# Patient Record
Sex: Female | Born: 1995 | Race: Black or African American | Hispanic: No | Marital: Single | Smoking: Never smoker
Health system: Southern US, Community
[De-identification: ages and names within clinical notes are randomized; demographics above are authoritative.]

## PROBLEM LIST (undated history)

## (undated) DIAGNOSIS — D259 Leiomyoma of uterus, unspecified: Secondary | ICD-10-CM

## (undated) HISTORY — PX: NO PAST SURGERIES: SHX2092

---

## 2015-12-18 ENCOUNTER — Ambulatory Visit (INDEPENDENT_AMBULATORY_CARE_PROVIDER_SITE_OTHER): Payer: Medicaid Other | Admitting: Obstetrics & Gynecology

## 2015-12-18 ENCOUNTER — Encounter: Payer: Self-pay | Admitting: Obstetrics & Gynecology

## 2015-12-18 ENCOUNTER — Other Ambulatory Visit (HOSPITAL_COMMUNITY): Payer: Self-pay | Admitting: Obstetrics & Gynecology

## 2015-12-18 ENCOUNTER — Other Ambulatory Visit (HOSPITAL_COMMUNITY)
Admission: RE | Admit: 2015-12-18 | Discharge: 2015-12-18 | Disposition: A | Discharge status: Home / Self Care | Payer: Medicaid Other | Source: Ambulatory Visit | Attending: Obstetrics & Gynecology | Admitting: Obstetrics & Gynecology

## 2015-12-18 VITALS — BP 123/84 | HR 123 | Ht 63.0 in | Wt 193.0 lb

## 2015-12-18 DIAGNOSIS — O0930 Supervision of pregnancy with insufficient antenatal care, unspecified trimester: Secondary | ICD-10-CM | POA: Insufficient documentation

## 2015-12-18 DIAGNOSIS — Z124 Encounter for screening for malignant neoplasm of cervix: Secondary | ICD-10-CM

## 2015-12-18 DIAGNOSIS — Z113 Encounter for screening for infections with a predominantly sexual mode of transmission: Secondary | ICD-10-CM | POA: Insufficient documentation

## 2015-12-18 DIAGNOSIS — Z01419 Encounter for gynecological examination (general) (routine) without abnormal findings: Secondary | ICD-10-CM | POA: Diagnosis not present

## 2015-12-18 DIAGNOSIS — O0932 Supervision of pregnancy with insufficient antenatal care, second trimester: Secondary | ICD-10-CM

## 2015-12-18 DIAGNOSIS — Z3402 Encounter for supervision of normal first pregnancy, second trimester: Secondary | ICD-10-CM

## 2015-12-18 DIAGNOSIS — Z3400 Encounter for supervision of normal first pregnancy, unspecified trimester: Secondary | ICD-10-CM | POA: Insufficient documentation

## 2015-12-18 DIAGNOSIS — E669 Obesity, unspecified: Secondary | ICD-10-CM | POA: Diagnosis not present

## 2015-12-18 DIAGNOSIS — O99212 Obesity complicating pregnancy, second trimester: Secondary | ICD-10-CM

## 2015-12-18 DIAGNOSIS — Z3689 Encounter for other specified antenatal screening: Secondary | ICD-10-CM

## 2015-12-18 DIAGNOSIS — Z3401 Encounter for supervision of normal first pregnancy, first trimester: Secondary | ICD-10-CM | POA: Insufficient documentation

## 2015-12-18 DIAGNOSIS — O99210 Obesity complicating pregnancy, unspecified trimester: Secondary | ICD-10-CM

## 2015-12-18 NOTE — Progress Notes (Signed)
  Subjective:    Sue Miller is a 20 yo S AA G1P0 [redacted]w[redacted]d being seen today for her first obstetrical visit.  Her obstetrical history is significant for late prenatal care, obesity in pregnancy. Patient does intend to breast feed. Pregnancy history fully reviewed.  Patient reports no complaints.  Vitals:   12/18/15 0841 12/18/15 0842  BP: 123/84   Pulse: (!) 123   Weight: 193 lb (87.5 kg)   Height:  5\' 3"  (1.6 m)    HISTORY: OB History  Gravida Para Term Preterm AB Living  1            SAB TAB Ectopic Multiple Live Births               # Outcome Date GA Lbr Len/2nd Weight Sex Delivery Anes PTL Lv  1 Current              History reviewed. No pertinent past medical history. Past Surgical History:  Procedure Laterality Date  . NO PAST SURGERIES     Family History  Problem Relation Age of Onset  . Adopted: Yes  . Cancer Neg Hx   . Hypertension Neg Hx      Exam    Uterus:     Pelvic Exam:    Perineum: No Hemorrhoids   Vulva: normal   Vagina:  normal mucosa   pH:    Cervix: anteverted   Adnexa: normal adnexa   Bony Pelvis: android  System: Breast:  normal appearance, no masses or tenderness   Skin: normal coloration and turgor, no rashes    Neurologic: oriented   Extremities: normal strength, tone, and muscle mass   HEENT PERRLA   Mouth/Teeth mucous membranes moist, pharynx normal without lesions   Neck supple   Cardiovascular: regular rate and rhythm   Respiratory:  appears well, vitals normal, no respiratory distress, acyanotic, normal RR, ear and throat exam is normal, neck free of mass or lymphadenopathy, chest clear, no wheezing, crepitations, rhonchi, normal symmetric air entry   Abdomen: soft, non-tender; bowel sounds normal; no masses,  no organomegaly   Urinary: urethral meatus normal      Assessment:    Pregnancy: G1P0 Patient Active Problem List   Diagnosis Date Noted  . Supervision of normal first pregnancy 12/18/2015  . Obesity in pregnancy  12/18/2015  . Late prenatal care 12/18/2015        Plan:     Initial labs drawn. Prenatal vitamins. Problem list reviewed and updated. Genetic Screening discussed too late  Ultrasound discussed; fetal survey: ordered.  Follow up in 2 weeks. Pap and flu vaccine today Plan for 2 hour GTT, tdap, and labs at next visit  Emily Filbert 12/18/2015

## 2015-12-18 NOTE — Progress Notes (Signed)
Bedside ultrasound performed and head circumference 23.2 cm - consistent with dating by LMP. Fetal heart rate 134 bpm. Kathrene Alu RNBSN

## 2015-12-18 NOTE — Addendum Note (Signed)
Addended by: Phill Myron on: 12/18/2015 10:46 AM   Modules accepted: Orders

## 2015-12-19 LAB — OBSTETRIC PANEL
ANTIBODY SCREEN: NEGATIVE
BASOS PCT: 0 %
Basophils Absolute: 0 cells/uL (ref 0–200)
EOS PCT: 2 %
Eosinophils Absolute: 150 cells/uL (ref 15–500)
HEMATOCRIT: 34.7 % — AB (ref 35.0–45.0)
HEMOGLOBIN: 11.3 g/dL — AB (ref 11.7–15.5)
Hepatitis B Surface Ag: NEGATIVE
LYMPHS PCT: 19 %
Lymphs Abs: 1425 cells/uL (ref 850–3900)
MCH: 30.5 pg (ref 27.0–33.0)
MCHC: 32.6 g/dL (ref 32.0–36.0)
MCV: 93.5 fL (ref 80.0–100.0)
MONOS PCT: 7 %
MPV: 9.9 fL (ref 7.5–12.5)
Monocytes Absolute: 525 cells/uL (ref 200–950)
NEUTROS ABS: 5400 {cells}/uL (ref 1500–7800)
Neutrophils Relative %: 72 %
Platelets: 353 10*3/uL (ref 140–400)
RBC: 3.71 MIL/uL — ABNORMAL LOW (ref 3.80–5.10)
RDW: 13.6 % (ref 11.0–15.0)
RUBELLA: 1.75 {index} — AB (ref <0.90)
Rh Type: POSITIVE
WBC: 7.5 10*3/uL (ref 3.8–10.8)

## 2015-12-19 LAB — SICKLE CELL SCREEN: Sickle Cell Screen: NEGATIVE

## 2015-12-19 LAB — HIV ANTIBODY (ROUTINE TESTING W REFLEX): HIV: NONREACTIVE

## 2015-12-22 LAB — PAIN MGMT, OPIATES EXP. QN, UR
Codeine: NEGATIVE ng/mL (ref <50)
Hydrocodone: NEGATIVE ng/mL (ref <50)
Hydromorphone: NEGATIVE ng/mL (ref <50)
Morphine: NEGATIVE ng/mL (ref <50)
NORHYDROCODONE: NEGATIVE ng/mL (ref <50)
NOROXYCODONE: NEGATIVE ng/mL (ref <50)
OXYCODONE: NEGATIVE ng/mL (ref <50)
Oxymorphone: NEGATIVE ng/mL (ref <50)

## 2015-12-22 LAB — CULTURE, URINE COMPREHENSIVE

## 2015-12-22 LAB — CYTOLOGY - PAP: Diagnosis: NEGATIVE

## 2015-12-22 LAB — GC/CHLAMYDIA PROBE AMP (~~LOC~~) NOT AT ARMC
Chlamydia: NEGATIVE
Neisseria Gonorrhea: POSITIVE — AB

## 2015-12-23 ENCOUNTER — Encounter (HOSPITAL_COMMUNITY): Payer: Self-pay | Admitting: Obstetrics & Gynecology

## 2015-12-25 ENCOUNTER — Telehealth: Payer: Self-pay

## 2015-12-25 NOTE — Telephone Encounter (Signed)
Unable to leave message for patient due to "mailbox full" at phone number we have listed. Kathrene Alu RN   Letter mailed to home requesting she call office as soon as possible. Kathrene Alu RNBSN

## 2015-12-31 ENCOUNTER — Ambulatory Visit (HOSPITAL_COMMUNITY)
Admission: RE | Admit: 2015-12-31 | Discharge: 2015-12-31 | Disposition: A | Discharge status: Home / Self Care | Payer: Medicaid Other | Source: Ambulatory Visit | Attending: Obstetrics & Gynecology | Admitting: Obstetrics & Gynecology

## 2015-12-31 ENCOUNTER — Other Ambulatory Visit: Payer: Self-pay | Admitting: Obstetrics & Gynecology

## 2015-12-31 DIAGNOSIS — Z3400 Encounter for supervision of normal first pregnancy, unspecified trimester: Secondary | ICD-10-CM

## 2015-12-31 DIAGNOSIS — Z363 Encounter for antenatal screening for malformations: Secondary | ICD-10-CM | POA: Diagnosis not present

## 2015-12-31 DIAGNOSIS — Z3A27 27 weeks gestation of pregnancy: Secondary | ICD-10-CM | POA: Insufficient documentation

## 2015-12-31 DIAGNOSIS — O0932 Supervision of pregnancy with insufficient antenatal care, second trimester: Secondary | ICD-10-CM

## 2016-01-01 ENCOUNTER — Other Ambulatory Visit (INDEPENDENT_AMBULATORY_CARE_PROVIDER_SITE_OTHER): Payer: Medicaid Other

## 2016-01-01 VITALS — BP 111/66 | HR 83 | Wt 193.0 lb

## 2016-01-01 DIAGNOSIS — Z3493 Encounter for supervision of normal pregnancy, unspecified, third trimester: Secondary | ICD-10-CM | POA: Diagnosis not present

## 2016-01-01 DIAGNOSIS — Z23 Encounter for immunization: Secondary | ICD-10-CM | POA: Diagnosis not present

## 2016-01-01 DIAGNOSIS — O98212 Gonorrhea complicating pregnancy, second trimester: Secondary | ICD-10-CM

## 2016-01-01 LAB — CBC
HEMATOCRIT: 34.4 % — AB (ref 35.0–45.0)
Hemoglobin: 11.3 g/dL — ABNORMAL LOW (ref 11.7–15.5)
MCH: 30.5 pg (ref 27.0–33.0)
MCHC: 32.8 g/dL (ref 32.0–36.0)
MCV: 93 fL (ref 80.0–100.0)
MPV: 10 fL (ref 7.5–12.5)
PLATELETS: 366 10*3/uL (ref 140–400)
RBC: 3.7 MIL/uL — ABNORMAL LOW (ref 3.80–5.10)
RDW: 13.7 % (ref 11.0–15.0)
WBC: 7.4 10*3/uL (ref 3.8–10.8)

## 2016-01-01 MED ORDER — CEFTRIAXONE SODIUM 250 MG IJ SOLR
250.0000 mg | Freq: Once | INTRAMUSCULAR | Status: AC
  Administered 2016-01-01: 250 mg via INTRAMUSCULAR

## 2016-01-01 MED ORDER — AZITHROMYCIN 500 MG PO TABS: 1000.0000 mg | ORAL_TABLET | Freq: Every day | ORAL | 0 refills | Status: DC

## 2016-01-02 LAB — 2HR GTT W 1 HR, CARPENTER, 75 G
GLUCOSE, 1 HR, GEST: 101 mg/dL (ref <180)
GLUCOSE, 2 HR, GEST: 84 mg/dL (ref <153)
Glucose, Fasting, Gest: 74 mg/dL (ref 65–91)

## 2016-01-02 LAB — RPR

## 2016-01-02 LAB — HIV ANTIBODY (ROUTINE TESTING W REFLEX): HIV 1&2 Ab, 4th Generation: NONREACTIVE

## 2016-01-14 ENCOUNTER — Ambulatory Visit (INDEPENDENT_AMBULATORY_CARE_PROVIDER_SITE_OTHER): Payer: Medicaid Other | Admitting: Obstetrics & Gynecology

## 2016-01-14 ENCOUNTER — Other Ambulatory Visit: Payer: Self-pay

## 2016-01-14 VITALS — BP 99/58 | HR 92

## 2016-01-14 DIAGNOSIS — Z3403 Encounter for supervision of normal first pregnancy, third trimester: Secondary | ICD-10-CM

## 2016-01-14 DIAGNOSIS — O0930 Supervision of pregnancy with insufficient antenatal care, unspecified trimester: Secondary | ICD-10-CM

## 2016-01-14 DIAGNOSIS — O99213 Obesity complicating pregnancy, third trimester: Secondary | ICD-10-CM

## 2016-01-14 DIAGNOSIS — O99210 Obesity complicating pregnancy, unspecified trimester: Secondary | ICD-10-CM

## 2016-01-14 DIAGNOSIS — E669 Obesity, unspecified: Secondary | ICD-10-CM

## 2016-01-14 MED ORDER — AZITHROMYCIN 500 MG PO TABS: 1000.0000 mg | ORAL_TABLET | Freq: Every day | ORAL | 0 refills | Status: DC

## 2016-01-14 NOTE — Patient Instructions (Signed)
Third Trimester of Pregnancy The third trimester is from week 29 through week 40 (months 7 through 9). The third trimester is a time when the unborn baby (fetus) is growing rapidly. At the end of the ninth month, the fetus is about 20 inches in length and weighs 6-10 pounds. Body changes during your third trimester Your body goes through many changes during pregnancy. The changes vary from woman to woman. During the third trimester:  Your weight will continue to increase. You can expect to gain 25-35 pounds (11-16 kg) by the end of the pregnancy.  You may begin to get stretch marks on your hips, abdomen, and breasts.  You may urinate more often because the fetus is moving lower into your pelvis and pressing on your bladder.  You may develop or continue to have heartburn. This is caused by increased hormones that slow down muscles in the digestive tract.  You may develop or continue to have constipation because increased hormones slow digestion and cause the muscles that push waste through your intestines to relax.  You may develop hemorrhoids. These are swollen veins (varicose veins) in the rectum that can itch or be painful.  You may develop swollen, bulging veins (varicose veins) in your legs.  You may have increased body aches in the pelvis, back, or thighs. This is due to weight gain and increased hormones that are relaxing your joints.  You may have changes in your hair. These can include thickening of your hair, rapid growth, and changes in texture. Some women also have hair loss during or after pregnancy, or hair that feels dry or thin. Your hair will most likely return to normal after your baby is born.  Your breasts will continue to grow and they will continue to become tender. A yellow fluid (colostrum) may leak from your breasts. This is the first milk you are producing for your baby.  Your belly button may stick out.  You may notice more swelling in your hands, face, or  ankles.  You may have increased tingling or numbness in your hands, arms, and legs. The skin on your belly may also feel numb.  You may feel short of breath because of your expanding uterus.  You may have more problems sleeping. This can be caused by the size of your belly, increased need to urinate, and an increase in your body's metabolism.  You may notice the fetus "dropping," or moving lower in your abdomen.  You may have increased vaginal discharge.  Your cervix becomes thin and soft (effaced) near your due date. What to expect at prenatal visits You will have prenatal exams every 2 weeks until week 36. Then you will have weekly prenatal exams. During a routine prenatal visit:  You will be weighed to make sure you and the fetus are growing normally.  Your blood pressure will be taken.  Your abdomen will be measured to track your baby's growth.  The fetal heartbeat will be listened to.  Any test results from the previous visit will be discussed.  You may have a cervical check near your due date to see if you have effaced. At around 36 weeks, your health care provider will check your cervix. At the same time, your health care provider will also perform a test on the secretions of the vaginal tissue. This test is to determine if a type of bacteria, Group B streptococcus, is present. Your health care provider will explain this further. Your health care provider may ask you:    What your birth plan is.  How you are feeling.  If you are feeling the baby move.  If you have had any abnormal symptoms, such as leaking fluid, bleeding, severe headaches, or abdominal cramping.  If you are using any tobacco products, including cigarettes, chewing tobacco, and electronic cigarettes.  If you have any questions. Other tests or screenings that may be performed during your third trimester include:  Blood tests that check for low iron levels (anemia).  Fetal testing to check the health,  activity level, and growth of the fetus. Testing is done if you have certain medical conditions or if there are problems during the pregnancy.  Nonstress test (NST). This test checks the health of your baby to make sure there are no signs of problems, such as the baby not getting enough oxygen. During this test, a belt is placed around your belly. The baby is made to move, and its heart rate is monitored during movement. What is false labor? False labor is a condition in which you feel small, irregular tightenings of the muscles in the womb (contractions) that eventually go away. These are called Braxton Hicks contractions. Contractions may last for hours, days, or even weeks before true labor sets in. If contractions come at regular intervals, become more frequent, increase in intensity, or become painful, you should see your health care provider. What are the signs of labor?  Abdominal cramps.  Regular contractions that start at 10 minutes apart and become stronger and more frequent with time.  Contractions that start on the top of the uterus and spread down to the lower abdomen and back.  Increased pelvic pressure and dull back pain.  A watery or bloody mucus discharge that comes from the vagina.  Leaking of amniotic fluid. This is also known as your "water breaking." It could be a slow trickle or a gush. Let your doctor know if it has a color or strange odor. If you have any of these signs, call your health care provider right away, even if it is before your due date. Follow these instructions at home: Eating and drinking  Continue to eat regular, healthy meals.  Do not eat:  Raw meat or meat spreads.  Unpasteurized milk or cheese.  Unpasteurized juice.  Store-made salad.  Refrigerated smoked seafood.  Hot dogs or deli meat, unless they are piping hot.  More than 6 ounces of albacore tuna a week.  Shark, swordfish, king mackerel, or tile fish.  Store-made salads.  Raw  sprouts, such as mung bean or alfalfa sprouts.  Take prenatal vitamins as told by your health care provider.  Take 1000 mg of calcium daily as told by your health care provider.  If you develop constipation:  Take over-the-counter or prescription medicines.  Drink enough fluid to keep your urine clear or pale yellow.  Eat foods that are high in fiber, such as fresh fruits and vegetables, whole grains, and beans.  Limit foods that are high in fat and processed sugars, such as fried and sweet foods. Activity  Exercise only as directed by your health care provider. Healthy pregnant women should aim for 2 hours and 30 minutes of moderate exercise per week. If you experience any pain or discomfort while exercising, stop.  Avoid heavy lifting.  Do not exercise in extreme heat or humidity, or at high altitudes.  Wear low-heel, comfortable shoes.  Practice good posture.  Do not travel far distances unless it is absolutely necessary and only with the approval   of your health care provider.  Wear your seat belt at all times while in a car, on a bus, or on a plane.  Take frequent breaks and rest with your legs elevated if you have leg cramps or low back pain.  Do not use hot tubs, steam rooms, or saunas.  You may continue to have sex unless your health care provider tells you otherwise. Lifestyle  Do not use any products that contain nicotine or tobacco, such as cigarettes and e-cigarettes. If you need help quitting, ask your health care provider.  Do not drink alcohol.  Do not use any medicinal herbs or unprescribed drugs. These chemicals affect the formation and growth of the baby.  If you develop varicose veins:  Wear support pantyhose or compression stockings as told by your healthcare provider.  Elevate your feet for 15 minutes, 3-4 times a day.  Wear a supportive maternity bra to help with breast tenderness. General instructions  Take over-the-counter and prescription  medicines only as told by your health care provider. There are medicines that are either safe or unsafe to take during pregnancy.  Take warm sitz baths to soothe any pain or discomfort caused by hemorrhoids. Use hemorrhoid cream or witch hazel if your health care provider approves.  Avoid cat litter boxes and soil used by cats. These carry germs that can cause birth defects in the baby. If you have a cat, ask someone to clean the litter box for you.  To prepare for the arrival of your baby:  Take prenatal classes to understand, practice, and ask questions about the labor and delivery.  Make a trial run to the hospital.  Visit the hospital and tour the maternity area.  Arrange for maternity or paternity leave through employers.  Arrange for family and friends to take care of pets while you are in the hospital.  Purchase a rear-facing car seat and make sure you know how to install it in your car.  Pack your hospital bag.  Prepare the baby's nursery. Make sure to remove all pillows and stuffed animals from the baby's crib to prevent suffocation.  Visit your dentist if you have not gone during your pregnancy. Use a soft toothbrush to brush your teeth and be gentle when you floss.  Keep all prenatal follow-up visits as told by your health care provider. This is important. Contact a health care provider if:  You are unsure if you are in labor or if your water has broken.  You become dizzy.  You have mild pelvic cramps, pelvic pressure, or nagging pain in your abdominal area.  You have lower back pain.  You have persistent nausea, vomiting, or diarrhea.  You have an unusual or bad smelling vaginal discharge.  You have pain when you urinate. Get help right away if:  You have a fever.  You are leaking fluid from your vagina.  You have spotting or bleeding from your vagina.  You have severe abdominal pain or cramping.  You have rapid weight loss or weight gain.  You have  shortness of breath with chest pain.  You notice sudden or extreme swelling of your face, hands, ankles, feet, or legs.  Your baby makes fewer than 10 movements in 2 hours.  You have severe headaches that do not go away with medicine.  You have vision changes. Summary  The third trimester is from week 29 through week 40, months 7 through 9. The third trimester is a time when the unborn baby (fetus)   is growing rapidly.  During the third trimester, your discomfort may increase as you and your baby continue to gain weight. You may have abdominal, leg, and back pain, sleeping problems, and an increased need to urinate.  During the third trimester your breasts will keep growing and they will continue to become tender. A yellow fluid (colostrum) may leak from your breasts. This is the first milk you are producing for your baby.  False labor is a condition in which you feel small, irregular tightenings of the muscles in the womb (contractions) that eventually go away. These are called Braxton Hicks contractions. Contractions may last for hours, days, or even weeks before true labor sets in.  Signs of labor can include: abdominal cramps; regular contractions that start at 10 minutes apart and become stronger and more frequent with time; watery or bloody mucus discharge that comes from the vagina; increased pelvic pressure and dull back pain; and leaking of amniotic fluid. This information is not intended to replace advice given to you by your health care provider. Make sure you discuss any questions you have with your health care provider. Document Released: 12/29/2000 Document Revised: 06/12/2015 Document Reviewed: 03/07/2012 Elsevier Interactive Patient Education  2017 [employer]  

## 2016-01-14 NOTE — Progress Notes (Signed)
   PRENATAL VISIT NOTE  Subjective:  Sue Miller is a 20 y.o. G1P0 at [redacted]w[redacted]d being seen today for ongoing prenatal care.  She is currently monitored for the following issues for this low-risk pregnancy and has Supervision of normal first pregnancy; Obesity in pregnancy; and Late prenatal care on her problem list.  Patient reports no complaints.  Contractions: Not present. Vag. Bleeding: None.  Movement: Present. Denies leaking of fluid.   The following portions of the patient's history were reviewed and updated as appropriate: allergies, current medications, past family history, past medical history, past social history, past surgical history and problem list. Problem list updated.  Objective:   Vitals:   01/14/16 1455  BP: (!) 99/58  Pulse: 92    Fetal Status: Fetal Heart Rate (bpm): 137   Movement: Present     General:  Alert, oriented and cooperative. Patient is in no acute distress.  Skin: Skin is warm and dry. No rash noted.   Cardiovascular: Normal heart rate noted  Respiratory: Normal respiratory effort, no problems with respiration noted  Abdomen: Soft, gravid, appropriate for gestational age. Pain/Pressure: Present     Pelvic:  Cervical exam deferred        Extremities: Normal range of motion.  Edema: Trace  Mental Status: Normal mood and affect. Normal behavior. Normal judgment and thought content.   Assessment and Plan:  Pregnancy: G1P0 at [redacted]w[redacted]d  1. Encounter for supervision of normal first pregnancy in third trimester  Reviewed PNL  2. Obesity in pregnancy  3. Late prenatal care  Reviewed the importance of [place]  4. +GC- received shot did not take the Rx.  Rec tx for FOB. Pt will pick up Azithromycin today. Got Rocephin last visit.    Preterm labor symptoms and general obstetric precautions including but not limited to vaginal bleeding, contractions, leaking of fluid and fetal movement were reviewed in detail with the patient. Please refer to After Visit  Summary for other counseling recommendations.  No Follow-up on file.   Lavonia Drafts, MD

## 2016-01-14 NOTE — Addendum Note (Signed)
Addended by: Lyndal Rainbow on: 01/14/2016 04:41 PM   Modules accepted: Orders

## 2016-01-19 NOTE — L&D Delivery Note (Signed)
Delivery Note At 12:45 AM a viable female was delivered via Vaginal, Spontaneous Delivery (Presentation: vertex; LOA ).  APGAR: 9, 10; weight pending .   Placenta status: intact, spontaneously.  Cord: 3 vessels complicated only by a 3rd degree laceration, repaired .  Cord pH: n/a  Anesthesia:  Epidural, lidocaine Episiotomy: None Lacerations: 3rd degree, class A (<5% EAS torn) Suture Repair: 3.0 vicryl Est. Blood Loss (mL):  225cc  Mom to postpartum.  Baby to Couplet care / Skin to Skin.  Allene Pyo do [place] 03/28/2016, 1:28 AM  OB FELLOW DELIVERY ATTESTATION  I was gloved and present for the delivery in its entirety, and I agree with the above resident's note.    Katherine Basset, DO OB Fellow

## 2016-02-02 ENCOUNTER — Encounter: Payer: Self-pay | Admitting: Family Medicine

## 2016-02-02 ENCOUNTER — Ambulatory Visit (INDEPENDENT_AMBULATORY_CARE_PROVIDER_SITE_OTHER): Payer: Medicaid Other | Admitting: Family Medicine

## 2016-02-02 VITALS — BP 113/64 | HR 70 | Wt 203.0 lb

## 2016-02-02 DIAGNOSIS — Z3403 Encounter for supervision of normal first pregnancy, third trimester: Secondary | ICD-10-CM

## 2016-02-02 NOTE — Progress Notes (Signed)
   PRENATAL VISIT NOTE  Subjective:  Sue Miller is a 21 y.o. G1P0 at [redacted]w[redacted]d being seen today for ongoing prenatal care.  She is currently monitored for the following issues for this low-risk pregnancy and has Supervision of normal first pregnancy; Obesity in pregnancy; and Late prenatal care on her problem list.  Patient reports no complaints.  Contractions: Not present. Vag. Bleeding: None.  Movement: Present. Denies leaking of fluid.   The following portions of the patient's history were reviewed and updated as appropriate: allergies, current medications, past family history, past medical history, past social history, past surgical history and problem list. Problem list updated.  Objective:   Vitals:   02/02/16 0859  BP: 113/64  Pulse: 70  Weight: 203 lb (92.1 kg)    Fetal Status: Fetal Heart Rate (bpm): 134 Fundal Height: 32 cm Movement: Present  Presentation: Vertex  General:  Alert, oriented and cooperative. Patient is in no acute distress.  Skin: Skin is warm and dry. No rash noted.   Cardiovascular: Normal heart rate noted  Respiratory: Normal respiratory effort, no problems with respiration noted  Abdomen: Soft, gravid, appropriate for gestational age. Pain/Pressure: Present     Pelvic:  Cervical exam deferred        Extremities: Normal range of motion.  Edema: Trace  Mental Status: Normal mood and affect. Normal behavior. Normal judgment and thought content.   Assessment and Plan:  Pregnancy: G1P0 at [redacted]w[redacted]d  1. Encounter for supervision of normal first pregnancy in third trimester FHT and FH normal  Preterm labor symptoms and general obstetric precautions including but not limited to vaginal bleeding, contractions, leaking of fluid and fetal movement were reviewed in detail with the patient. Please refer to After Visit Summary for other counseling recommendations.  Return in about 2 weeks (around 02/16/2016) for OB f/u.   [name], DO

## 2016-02-16 ENCOUNTER — Ambulatory Visit (INDEPENDENT_AMBULATORY_CARE_PROVIDER_SITE_OTHER): Payer: Medicaid Other | Admitting: Family Medicine

## 2016-02-16 VITALS — BP 119/76 | HR 77 | Wt 210.0 lb

## 2016-02-16 DIAGNOSIS — Z3403 Encounter for supervision of normal first pregnancy, third trimester: Secondary | ICD-10-CM

## 2016-02-16 DIAGNOSIS — O133 Gestational [pregnancy-induced] hypertension without significant proteinuria, third trimester: Secondary | ICD-10-CM

## 2016-02-16 DIAGNOSIS — O163 Unspecified maternal hypertension, third trimester: Secondary | ICD-10-CM

## 2016-02-16 NOTE — Patient Instructions (Addendum)
Third Trimester of Pregnancy The third trimester is from week 29 through week 40 (months 7 through 9). The third trimester is a time when the unborn baby (fetus) is growing rapidly. At the end of the ninth month, the fetus is about 20 inches in length and weighs 6-10 pounds. Body changes during your third trimester Your body goes through many changes during pregnancy. The changes vary from woman to woman. During the third trimester:  Your weight will continue to increase. You can expect to gain 25-35 pounds (11-16 kg) by the end of the pregnancy.  You may begin to get stretch marks on your hips, abdomen, and breasts.  You may urinate more often because the fetus is moving lower into your pelvis and pressing on your bladder.  You may develop or continue to have heartburn. This is caused by increased hormones that slow down muscles in the digestive tract.  You may develop or continue to have constipation because increased hormones slow digestion and cause the muscles that push waste through your intestines to relax.  You may develop hemorrhoids. These are swollen veins (varicose veins) in the rectum that can itch or be painful.  You may develop swollen, bulging veins (varicose veins) in your legs.  You may have increased body aches in the pelvis, back, or thighs. This is due to weight gain and increased hormones that are relaxing your joints.  You may have changes in your hair. These can include thickening of your hair, rapid growth, and changes in texture. Some women also have hair loss during or after pregnancy, or hair that feels dry or thin. Your hair will most likely return to normal after your baby is born.  Your breasts will continue to grow and they will continue to become tender. A yellow fluid (colostrum) may leak from your breasts. This is the first milk you are producing for your baby.  Your belly button may stick out.  You may notice more swelling in your hands, face, or  ankles.  You may have increased tingling or numbness in your hands, arms, and legs. The skin on your belly may also feel numb.  You may feel short of breath because of your expanding uterus.  You may have more problems sleeping. This can be caused by the size of your belly, increased need to urinate, and an increase in your body's metabolism.  You may notice the fetus "dropping," or moving lower in your abdomen.  You may have increased vaginal discharge.  Your cervix becomes thin and soft (effaced) near your due date. What to expect at prenatal visits You will have prenatal exams every 2 weeks until week 36. Then you will have weekly prenatal exams. During a routine prenatal visit:  You will be weighed to make sure you and the fetus are growing normally.  Your blood pressure will be taken.  Your abdomen will be measured to track your baby's growth.  The fetal heartbeat will be listened to.  Any test results from the previous visit will be discussed.  You may have a cervical check near your due date to see if you have effaced. At around 36 weeks, your health care provider will check your cervix. At the same time, your health care provider will also perform a test on the secretions of the vaginal tissue. This test is to determine if a type of bacteria, Group B streptococcus, is present. Your health care provider will explain this further. Your health care provider may ask you:    What your birth plan is.  How you are feeling.  If you are feeling the baby move.  If you have had any abnormal symptoms, such as leaking fluid, bleeding, severe headaches, or abdominal cramping.  If you are using any tobacco products, including cigarettes, chewing tobacco, and electronic cigarettes.  If you have any questions. Other tests or screenings that may be performed during your third trimester include:  Blood tests that check for low iron levels (anemia).  Fetal testing to check the health,  activity level, and growth of the fetus. Testing is done if you have certain medical conditions or if there are problems during the pregnancy.  Nonstress test (NST). This test checks the health of your baby to make sure there are no signs of problems, such as the baby not getting enough oxygen. During this test, a belt is placed around your belly. The baby is made to move, and its heart rate is monitored during movement. What is false labor? False labor is a condition in which you feel small, irregular tightenings of the muscles in the womb (contractions) that eventually go away. These are called Braxton Hicks contractions. Contractions may last for hours, days, or even weeks before true labor sets in. If contractions come at regular intervals, become more frequent, increase in intensity, or become painful, you should see your health care provider. What are the signs of labor?  Abdominal cramps.  Regular contractions that start at 10 minutes apart and become stronger and more frequent with time.  Contractions that start on the top of the uterus and spread down to the lower abdomen and back.  Increased pelvic pressure and dull back pain.  A watery or bloody mucus discharge that comes from the vagina.  Leaking of amniotic fluid. This is also known as your "water breaking." It could be a slow trickle or a gush. Let your doctor know if it has a color or strange odor. If you have any of these signs, call your health care provider right away, even if it is before your due date. Follow these instructions at home: Eating and drinking  Continue to eat regular, healthy meals.  Do not eat:  Raw meat or meat spreads.  Unpasteurized milk or cheese.  Unpasteurized juice.  Store-made salad.  Refrigerated smoked seafood.  Hot dogs or deli meat, unless they are piping hot.  More than 6 ounces of albacore tuna a week.  Shark, swordfish, king mackerel, or tile fish.  Store-made salads.  Raw  sprouts, such as mung bean or alfalfa sprouts.  Take prenatal vitamins as told by your health care provider.  Take 1000 mg of calcium daily as told by your health care provider.  If you develop constipation:  Take over-the-counter or prescription medicines.  Drink enough fluid to keep your urine clear or pale yellow.  Eat foods that are high in fiber, such as fresh fruits and vegetables, whole grains, and beans.  Limit foods that are high in fat and processed sugars, such as fried and sweet foods. Activity  Exercise only as directed by your health care provider. Healthy pregnant women should aim for 2 hours and 30 minutes of moderate exercise per week. If you experience any pain or discomfort while exercising, stop.  Avoid heavy lifting.  Do not exercise in extreme heat or humidity, or at high altitudes.  Wear low-heel, comfortable shoes.  Practice good posture.  Do not travel far distances unless it is absolutely necessary and only with the approval   of your health care provider.  Wear your seat belt at all times while in a car, on a bus, or on a plane.  Take frequent breaks and rest with your legs elevated if you have leg cramps or low back pain.  Do not use hot tubs, steam rooms, or saunas.  You may continue to have sex unless your health care provider tells you otherwise. Lifestyle  Do not use any products that contain nicotine or tobacco, such as cigarettes and e-cigarettes. If you need help quitting, ask your health care provider.  Do not drink alcohol.  Do not use any medicinal herbs or unprescribed drugs. These chemicals affect the formation and growth of the baby.  If you develop varicose veins:  Wear support pantyhose or compression stockings as told by your healthcare provider.  Elevate your feet for 15 minutes, 3-4 times a day.  Wear a supportive maternity bra to help with breast tenderness. General instructions  Take over-the-counter and prescription  medicines only as told by your health care provider. There are medicines that are either safe or unsafe to take during pregnancy.  Take warm sitz baths to soothe any pain or discomfort caused by hemorrhoids. Use hemorrhoid cream or witch hazel if your health care provider approves.  Avoid cat litter boxes and soil used by cats. These carry germs that can cause birth defects in the baby. If you have a cat, ask someone to clean the litter box for you.  To prepare for the arrival of your baby:  Take prenatal classes to understand, practice, and ask questions about the labor and delivery.  Make a trial run to the hospital.  Visit the hospital and tour the maternity area.  Arrange for maternity or paternity leave through employers.  Arrange for family and friends to take care of pets while you are in the hospital.  Purchase a rear-facing car seat and make sure you know how to install it in your car.  Pack your hospital bag.  Prepare the baby's nursery. Make sure to remove all pillows and stuffed animals from the baby's crib to prevent suffocation.  Visit your dentist if you have not gone during your pregnancy. Use a soft toothbrush to brush your teeth and be gentle when you floss.  Keep all prenatal follow-up visits as told by your health care provider. This is important. Contact a health care provider if:  You are unsure if you are in labor or if your water has broken.  You become dizzy.  You have mild pelvic cramps, pelvic pressure, or nagging pain in your abdominal area.  You have lower back pain.  You have persistent nausea, vomiting, or diarrhea.  You have an unusual or bad smelling vaginal discharge.  You have pain when you urinate. Get help right away if:  You have a fever.  You are leaking fluid from your vagina.  You have spotting or bleeding from your vagina.  You have severe abdominal pain or cramping.  You have rapid weight loss or weight gain.  You have  shortness of breath with chest pain.  You notice sudden or extreme swelling of your face, hands, ankles, feet, or legs.  Your baby makes fewer than 10 movements in 2 hours.  You have severe headaches that do not go away with medicine.  You have vision changes. Summary  The third trimester is from week 29 through week 40, months 7 through 9. The third trimester is a time when the unborn baby (fetus)   is growing rapidly.  During the third trimester, your discomfort may increase as you and your baby continue to gain weight. You may have abdominal, leg, and back pain, sleeping problems, and an increased need to urinate.  During the third trimester your breasts will keep growing and they will continue to become tender. A yellow fluid (colostrum) may leak from your breasts. This is the first milk you are producing for your baby.  False labor is a condition in which you feel small, irregular tightenings of the muscles in the womb (contractions) that eventually go away. These are called Braxton Hicks contractions. Contractions may last for hours, days, or even weeks before true labor sets in.  Signs of labor can include: abdominal cramps; regular contractions that start at 10 minutes apart and become stronger and more frequent with time; watery or bloody mucus discharge that comes from the vagina; increased pelvic pressure and dull back pain; and leaking of amniotic fluid. This information is not intended to replace advice given to you by your health care provider. Make sure you discuss any questions you have with your health care provider. Document Released: 12/29/2000 Document Revised: 06/12/2015 Document Reviewed: 03/07/2012 Elsevier Interactive Patient Education  2017 [employer]    Preeclampsia and Eclampsia Preeclampsia is a serious condition that develops only during pregnancy. It is also called toxemia of pregnancy. This condition causes high blood pressure along with other symptoms,  such as swelling and headaches. These symptoms may develop as the condition gets worse. Preeclampsia may occur at 20 weeks of pregnancy or later. Diagnosing and treating preeclampsia early is very important. If not treated early, it can cause serious problems for you and your baby. One problem it can lead to is eclampsia, which is a condition that causes muscle jerking or shaking (convulsions or seizures) in the mother. Delivering your baby is the best treatment for preeclampsia or eclampsia. Preeclampsia and eclampsia symptoms usually go away after your baby is born. What are the causes? The cause of preeclampsia is not known. What increases the risk? The following risk factors make you more likely to develop preeclampsia:  Being pregnant for the first time.  Having had preeclampsia during a past pregnancy.  Having a family history of preeclampsia.  Having high blood pressure.  Being pregnant with twins or triplets.  Being 29 or older.  Being African-American.  Having kidney disease or diabetes.  Having medical conditions such as lupus or blood diseases.  Being very overweight (obese). What are the signs or symptoms? The earliest signs of preeclampsia are:  High blood pressure.  Increased protein in your urine. Your health care provider will check for this at every visit before you give birth (prenatal visit). Other symptoms that may develop as the condition gets worse include:  Severe headaches.  Sudden weight gain.  Swelling of the hands, face, legs, and feet.  Nausea and vomiting.  Vision problems, such as blurred or double vision.  Numbness in the face, arms, legs, and feet.  Urinating less than usual.  Dizziness.  Slurred speech.  Abdominal pain, especially upper abdominal pain.  Convulsions or seizures. Symptoms generally go away after giving birth. How is this diagnosed? There are no screening tests for preeclampsia. Your health care provider will ask  you about symptoms and check for signs of preeclampsia during your prenatal visits. You may also have tests that include:  Urine tests.  Blood tests.  Checking your blood pressure.  Monitoring your baby's heart rate.  Ultrasound. How  is this treated? You and your health care provider will determine the treatment approach that is best for you. Treatment may include:  Having more frequent prenatal exams to check for signs of preeclampsia, if you have an increased risk for preeclampsia.  Bed rest.  Reducing how much salt (sodium) you eat.  Medicine to lower your blood pressure.  Staying in the hospital, if your condition is severe. There, treatment will focus on controlling your blood pressure and the amount of fluids in your body (fluid retention).  You may need to take medicine (magnesium sulfate) to prevent seizures. This medicine may be given as an injection or through an IV tube.  Delivering your baby early, if your condition gets worse. You may have your labor started with medicine (induced), or you may have a cesarean delivery. Follow these instructions at home: Eating and drinking  Drink enough fluid to keep your urine clear or pale yellow.  Eat a healthy diet that is low in sodium. Do not add salt to your food. Check nutrition labels to see how much sodium a food or beverage contains.  Avoid caffeine. Lifestyle  Do not use any products that contain nicotine or tobacco, such as cigarettes and e-cigarettes. If you need help quitting, ask your health care provider.  Do not use alcohol or drugs.  Avoid stress as much as possible. Rest and get plenty of sleep. General instructions  Take over-the-counter and prescription medicines only as told by your health care provider.  When lying down, lie on your side. This keeps pressure off of your baby.  When sitting or lying down, raise (elevate) your feet. Try putting some pillows underneath your lower legs.  Exercise  regularly. Ask your health care provider what kinds of exercise are best for you.  Keep all follow-up and prenatal visits as told by your health care provider. This is important. How is this prevented? To prevent preeclampsia or eclampsia from developing during another pregnancy:  Get proper medical care during pregnancy. Your health care provider may be able to prevent preeclampsia or diagnose and treat it early.  Your health care provider may have you take a low-dose aspirin or a calcium supplement during your next pregnancy.  You may have tests of your blood pressure and kidney function after giving birth.  Maintain a healthy weight. Ask your health care provider for help managing weight gain during pregnancy.  Work with your health care provider to manage any long-term (chronic) health conditions you have, such as diabetes or kidney problems. Contact a health care provider if:  You gain more weight than expected.  You have headaches.  You have nausea or vomiting.  You have abdominal pain.  You feel dizzy or light-headed. Get help right away if:  You develop sudden or severe swelling anywhere in your body. This usually happens in the legs.  You gain 5 lbs (2.3 kg) or more during one week.  You have severe:  Abdominal pain.  Headaches.  Dizziness.  Vision problems.  Confusion.  Nausea or vomiting.  You have a seizure.  You have trouble moving any part of your body.  You develop numbness in any part of your body.  You have trouble speaking.  You have any abnormal bleeding.  You pass out. This information is not intended to replace advice given to you by your health care provider. Make sure you discuss any questions you have with your health care provider. Document Released: 01/02/2000 Document Revised: 09/02/2015 Document Reviewed: 08/11/2015  Elsevier Interactive Patient Education  2017 [employer]  

## 2016-02-16 NOTE — Progress Notes (Signed)
   PRENATAL VISIT NOTE  Subjective:  Sue Miller is a 21 y.o. G1P0 at [redacted]w[redacted]d being seen today for ongoing prenatal care.  She is currently monitored for the following issues for this low-risk pregnancy and has Supervision of normal first pregnancy; Obesity in pregnancy; and Late prenatal care on her problem list.  Patient reports headache.  Contractions: Not present. Vag. Bleeding: None.  Movement: Present. Denies leaking of fluid.   The following portions of the patient's history were reviewed and updated as appropriate: allergies, current medications, past family history, past medical history, past social history, past surgical history and problem list. Problem list updated.  Objective:   Vitals:   02/16/16 0829 02/16/16 0848  BP: (!) 144/73 119/76  Pulse: 80 77  Weight: 210 lb (95.3 kg)     Fetal Status: Fetal Heart Rate (bpm): 136 Fundal Height: 33 cm Movement: Present  Presentation: Vertex  General:  Alert, oriented and cooperative. Patient is in no acute distress.  Skin: Skin is warm and dry. No rash noted.   Cardiovascular: Normal heart rate noted  Respiratory: Normal respiratory effort, no problems with respiration noted  Abdomen: Soft, gravid, appropriate for gestational age. Pain/Pressure: Absent     Pelvic:  Cervical exam deferred        Extremities: Normal range of motion.  Edema: Trace  Mental Status: Normal mood and affect. Normal behavior. Normal judgment and thought content.   Assessment and Plan:  Pregnancy: G1P0 at [redacted]w[redacted]d  1. Encounter for supervision of normal first pregnancy in third trimester Continue prenatal care.   2. Elevated blood pressure complicating pregnancy in third trimester, antepartum Repeat BP was improved. Reports headache and swelling in her hands and feet. Precautions reviewed. - CBC - Comprehensive metabolic panel - Protein / creatinine ratio, urine  Preterm labor symptoms and general obstetric precautions including but not limited to  vaginal bleeding, contractions, leaking of fluid and fetal movement were reviewed in detail with the patient. Please refer to After Visit Summary for other counseling recommendations.  Return in 1 week (on 02/23/2016) for ob visit.   Donnamae Jude, MD

## 2016-02-17 LAB — COMPREHENSIVE METABOLIC PANEL
ALBUMIN: 3.5 g/dL (ref 3.5–5.5)
ALT: 11 IU/L (ref 0–32)
AST: 19 IU/L (ref 0–40)
Albumin/Globulin Ratio: 1.3 (ref 1.2–2.2)
Alkaline Phosphatase: 198 IU/L — ABNORMAL HIGH (ref 39–117)
BILIRUBIN TOTAL: 0.2 mg/dL (ref 0.0–1.2)
BUN / CREAT RATIO: 10 (ref 9–23)
BUN: 6 mg/dL (ref 6–20)
CO2: 20 mmol/L (ref 18–29)
Calcium: 9.2 mg/dL (ref 8.7–10.2)
Chloride: 102 mmol/L (ref 96–106)
Creatinine, Ser: 0.58 mg/dL (ref 0.57–1.00)
GFR calc non Af Amer: 131 mL/min/{1.73_m2} (ref >59)
GFR, EST AFRICAN AMERICAN: 151 mL/min/{1.73_m2} (ref >59)
GLOBULIN, TOTAL: 2.6 g/dL (ref 1.5–4.5)
GLUCOSE: 76 mg/dL (ref 65–99)
Potassium: 4.7 mmol/L (ref 3.5–5.2)
SODIUM: 140 mmol/L (ref 134–144)
TOTAL PROTEIN: 6.1 g/dL (ref 6.0–8.5)

## 2016-02-17 LAB — CBC
HEMATOCRIT: 32.6 % — AB (ref 34.0–46.6)
HEMOGLOBIN: 10.8 g/dL — AB (ref 11.1–15.9)
MCH: 30 pg (ref 26.6–33.0)
MCHC: 33.1 g/dL (ref 31.5–35.7)
MCV: 91 fL (ref 79–97)
Platelets: 318 10*3/uL (ref 150–379)
RBC: 3.6 x10E6/uL — AB (ref 3.77–5.28)
RDW: 14.2 % (ref 12.3–15.4)
WBC: 6.5 10*3/uL (ref 3.4–10.8)

## 2016-02-17 LAB — PROTEIN / CREATININE RATIO, URINE
Creatinine, Urine: 228.3 mg/dL
Protein, Ur: 37.4 mg/dL
Protein/Creat Ratio: 164 mg/g creat (ref 0–200)

## 2016-02-23 ENCOUNTER — Ambulatory Visit (INDEPENDENT_AMBULATORY_CARE_PROVIDER_SITE_OTHER): Payer: Medicaid Other | Admitting: Family Medicine

## 2016-02-23 VITALS — BP 121/81 | HR 108 | Wt 212.0 lb

## 2016-02-23 DIAGNOSIS — O99213 Obesity complicating pregnancy, third trimester: Secondary | ICD-10-CM

## 2016-02-23 DIAGNOSIS — Z3403 Encounter for supervision of normal first pregnancy, third trimester: Secondary | ICD-10-CM

## 2016-02-23 DIAGNOSIS — E669 Obesity, unspecified: Secondary | ICD-10-CM

## 2016-02-23 DIAGNOSIS — O99210 Obesity complicating pregnancy, unspecified trimester: Secondary | ICD-10-CM

## 2016-02-23 NOTE — Progress Notes (Signed)
   PRENATAL VISIT NOTE  Subjective:  Sue Miller is a 21 y.o. G1P0 at [redacted]w[redacted]d being seen today for ongoing prenatal care.  She is currently monitored for the following issues for this low-risk pregnancy and has Supervision of normal first pregnancy; Obesity in pregnancy; and Late prenatal care on her problem list.  Patient reports no complaints.  Contractions: Irritability. Vag. Bleeding: None.  Movement: Present. Denies leaking of fluid.   The following portions of the patient's history were reviewed and updated as appropriate: allergies, current medications, past family history, past medical history, past social history, past surgical history and problem list. Problem list updated.  Objective:   Vitals:   02/23/16 0829  BP: 121/81  Pulse: (!) 108  Weight: 212 lb (96.2 kg)    Fetal Status: Fetal Heart Rate (bpm): 136 Fundal Height: 35 cm Movement: Present  Presentation: Vertex  General:  Alert, oriented and cooperative. Patient is in no acute distress.  Skin: Skin is warm and dry. No rash noted.   Cardiovascular: Normal heart rate noted  Respiratory: Normal respiratory effort, no problems with respiration noted  Abdomen: Soft, gravid, appropriate for gestational age. Pain/Pressure: Present     Pelvic:  Cervical exam deferred        Extremities: Normal range of motion.  Edema: Trace  Mental Status: Normal mood and affect. Normal behavior. Normal judgment and thought content.   Assessment and Plan:  Pregnancy: G1P0 at [redacted]w[redacted]d  1. Encounter for supervision of normal first pregnancy in third trimester FHT and FH normal. BP within range (had one elevated BP last week). Denies severe symptoms. 36 week labs next visit  2. Obesity in pregnancy   Preterm labor symptoms and general obstetric precautions including but not limited to vaginal bleeding, contractions, leaking of fluid and fetal movement were reviewed in detail with the patient. Please refer to After Visit Summary for other  counseling recommendations.  Return in about 1 week (around 03/01/2016) for OB f/u.   [name], DO

## 2016-03-05 ENCOUNTER — Ambulatory Visit (INDEPENDENT_AMBULATORY_CARE_PROVIDER_SITE_OTHER): Payer: Medicaid Other | Admitting: Obstetrics & Gynecology

## 2016-03-05 ENCOUNTER — Other Ambulatory Visit (HOSPITAL_COMMUNITY)
Admission: RE | Admit: 2016-03-05 | Discharge: 2016-03-05 | Disposition: A | Discharge status: Home / Self Care | Payer: Medicaid Other | Source: Ambulatory Visit | Attending: Obstetrics & Gynecology | Admitting: Obstetrics & Gynecology

## 2016-03-05 VITALS — BP 113/71 | HR 70 | Wt 216.0 lb

## 2016-03-05 DIAGNOSIS — E669 Obesity, unspecified: Secondary | ICD-10-CM

## 2016-03-05 DIAGNOSIS — O0933 Supervision of pregnancy with insufficient antenatal care, third trimester: Secondary | ICD-10-CM

## 2016-03-05 DIAGNOSIS — Z113 Encounter for screening for infections with a predominantly sexual mode of transmission: Secondary | ICD-10-CM | POA: Insufficient documentation

## 2016-03-05 DIAGNOSIS — O99210 Obesity complicating pregnancy, unspecified trimester: Secondary | ICD-10-CM

## 2016-03-05 DIAGNOSIS — O99213 Obesity complicating pregnancy, third trimester: Secondary | ICD-10-CM

## 2016-03-05 DIAGNOSIS — Z3490 Encounter for supervision of normal pregnancy, unspecified, unspecified trimester: Secondary | ICD-10-CM

## 2016-03-05 DIAGNOSIS — O0930 Supervision of pregnancy with insufficient antenatal care, unspecified trimester: Secondary | ICD-10-CM

## 2016-03-05 DIAGNOSIS — Z3403 Encounter for supervision of normal first pregnancy, third trimester: Secondary | ICD-10-CM

## 2016-03-05 DIAGNOSIS — O163 Unspecified maternal hypertension, third trimester: Secondary | ICD-10-CM

## 2016-03-05 NOTE — Patient Instructions (Signed)

## 2016-03-05 NOTE — Progress Notes (Signed)
   PRENATAL VISIT NOTE  Subjective:  Sue Miller is a 21 y.o. G1P0 at [redacted]w[redacted]d being seen today for ongoing prenatal care.  She is currently monitored for the following issues for this low-risk pregnancy and has Supervision of normal first pregnancy; Obesity in pregnancy; and Late prenatal care on her problem list.  Patient reports no complaints.  Contractions: Irritability. Vag. Bleeding: None.  Movement: Present. Denies leaking of fluid.   The following portions of the patient's history were reviewed and updated as appropriate: allergies, current medications, past family history, past medical history, past social history, past surgical history and problem list. Problem list updated.  Objective:   Vitals:   03/05/16 1014  BP: 113/71  Pulse: 70  Weight: 216 lb (98 kg)    Fetal Status:     Movement: Present     General:  Alert, oriented and cooperative. Patient is in no acute distress.  Skin: Skin is warm and dry. No rash noted.   Cardiovascular: Normal heart rate noted  Respiratory: Normal respiratory effort, no problems with respiration noted  Abdomen: Soft, gravid, appropriate for gestational age. Pain/Pressure: Present     Pelvic:  Cervical exam performed        Extremities: Normal range of motion.  Edema: Trace  Mental Status: Normal mood and affect. Normal behavior. Normal judgment and thought content.   Assessment and Plan:  Pregnancy: G1P0 at [redacted]w[redacted]d  1. Prenatal care, antepartum  - Culture, beta strep (group b only) - GC/Chlamydia probe amp ([place])not at [facility]  2. Encounter for supervision of normal first pregnancy in third trimester  3. Obesity in pregnancy  4. Late prenatal care  Preterm labor symptoms and general obstetric precautions including but not limited to vaginal bleeding, contractions, leaking of fluid and fetal movement were reviewed in detail with the patient. Please refer to After Visit Summary for other counseling recommendations.  Return in  about 1 week (around 03/12/2016).   Lavonia Drafts, MD

## 2016-03-08 LAB — CULTURE, BETA STREP (GROUP B ONLY): STREP GP B CULTURE: POSITIVE — AB

## 2016-03-08 LAB — GC/CHLAMYDIA PROBE AMP (~~LOC~~) NOT AT ARMC
CHLAMYDIA, DNA PROBE: NEGATIVE
NEISSERIA GONORRHEA: NEGATIVE

## 2016-03-10 ENCOUNTER — Encounter: Payer: Self-pay | Admitting: Obstetrics & Gynecology

## 2016-03-10 DIAGNOSIS — O99820 Streptococcus B carrier state complicating pregnancy: Secondary | ICD-10-CM | POA: Insufficient documentation

## 2016-03-11 ENCOUNTER — Ambulatory Visit (INDEPENDENT_AMBULATORY_CARE_PROVIDER_SITE_OTHER): Payer: Medicaid Other | Admitting: Family Medicine

## 2016-03-11 VITALS — BP 120/72 | HR 85 | Wt 218.0 lb

## 2016-03-11 DIAGNOSIS — Z3400 Encounter for supervision of normal first pregnancy, unspecified trimester: Secondary | ICD-10-CM

## 2016-03-11 DIAGNOSIS — O163 Unspecified maternal hypertension, third trimester: Secondary | ICD-10-CM

## 2016-03-11 DIAGNOSIS — Z3490 Encounter for supervision of normal pregnancy, unspecified, unspecified trimester: Secondary | ICD-10-CM

## 2016-03-11 DIAGNOSIS — O133 Gestational [pregnancy-induced] hypertension without significant proteinuria, third trimester: Secondary | ICD-10-CM

## 2016-03-11 NOTE — Progress Notes (Signed)
   PRENATAL VISIT NOTE  Subjective:  Sue Miller is a 21 y.o. G1P0 at [redacted]w[redacted]d being seen today for ongoing prenatal care.  She is currently monitored for the following issues for this low-risk pregnancy and has Supervision of normal first pregnancy; Obesity in pregnancy; Late prenatal care; and Group B streptococcal carriage complicating pregnancy on her problem list.  Patient reports mild headache.  Contractions: Irritability. Vag. Bleeding: None.  Movement: Present. Denies leaking of fluid.   The following portions of the patient's history were reviewed and updated as appropriate: allergies, current medications, past family history, past medical history, past social history, past surgical history and problem list. Problem list updated.  Objective:   Vitals:   03/11/16 0931 03/11/16 0946  BP: 140/89 120/72  Pulse: 74 85  Weight: 218 lb (98.9 kg)     Fetal Status: Fetal Heart Rate (bpm): 144   Movement: Present     General:  Alert, oriented and cooperative. Patient is in no acute distress.  Skin: Skin is warm and dry. No rash noted.   Cardiovascular: Normal heart rate noted  Respiratory: Normal respiratory effort, no problems with respiration noted  Abdomen: Soft, gravid, appropriate for gestational age. Pain/Pressure: Present     Pelvic:  Cervical exam deferred        Extremities: Normal range of motion.  Edema: Trace  Mental Status: Normal mood and affect. Normal behavior. Normal judgment and thought content.   Assessment and Plan:  Pregnancy: G1P0 at [redacted]w[redacted]d  1. Supervision of normal first pregnancy, antepartum FhT and FH normal.   2. Elevated blood pressure complicating pregnancy in third trimester, antepartum Will check CBC, CMP, UPC. Discussed severe preeclampsia symptoms. Just mild intermittent headache that improves quickly. BP on repeat normal. Will have patient come tomorrow for BP check. - CBC - Comprehensive metabolic panel - Protein / creatinine ratio,  urine    Term labor symptoms and general obstetric precautions including but not limited to vaginal bleeding, contractions, leaking of fluid and fetal movement were reviewed in detail with the patient. Please refer to After Visit Summary for other counseling recommendations.  No Follow-up on file.   [name], DO

## 2016-03-11 NOTE — Progress Notes (Signed)
Patient complaining of headaches over the last week. Repeat bp:120 72. Kathrene Alu RNBSN

## 2016-03-12 ENCOUNTER — Ambulatory Visit: Payer: Medicaid Other

## 2016-03-12 VITALS — BP 122/73 | HR 75 | Wt 218.0 lb

## 2016-03-12 DIAGNOSIS — O163 Unspecified maternal hypertension, third trimester: Secondary | ICD-10-CM

## 2016-03-12 LAB — COMPREHENSIVE METABOLIC PANEL
ALK PHOS: 263 IU/L — AB (ref 39–117)
ALT: 20 IU/L (ref 0–32)
AST: 21 IU/L (ref 0–40)
Albumin/Globulin Ratio: 1.2 (ref 1.2–2.2)
Albumin: 3.7 g/dL (ref 3.5–5.5)
BUN/Creatinine Ratio: 13 (ref 9–23)
BUN: 7 mg/dL (ref 6–20)
Bilirubin Total: <0.2 mg/dL (ref 0.0–1.2)
CHLORIDE: 102 mmol/L (ref 96–106)
CO2: 16 mmol/L — AB (ref 18–29)
CREATININE: 0.55 mg/dL — AB (ref 0.57–1.00)
Calcium: 9.4 mg/dL (ref 8.7–10.2)
GFR calc Af Amer: 154 (ref >59)
GFR calc non Af Amer: 134 (ref >59)
GLOBULIN, TOTAL: 3 (ref 1.5–4.5)
Glucose: 94 mg/dL (ref 65–99)
POTASSIUM: 4.4 mmol/L (ref 3.5–5.2)
SODIUM: 137 mmol/L (ref 134–144)
Total Protein: 6.7 g/dL (ref 6.0–8.5)

## 2016-03-12 LAB — CBC
HEMATOCRIT: 31.7 % — AB (ref 34.0–46.6)
Hemoglobin: 10.2 g/dL — ABNORMAL LOW (ref 11.1–15.9)
MCH: 28.7 pg (ref 26.6–33.0)
MCHC: 32.2 g/dL (ref 31.5–35.7)
MCV: 89 fL (ref 79–97)
Platelets: 287 10*3/uL (ref 150–379)
RBC: 3.55 x10E6/uL — ABNORMAL LOW (ref 3.77–5.28)
RDW: 14.5 % (ref 12.3–15.4)
WBC: 8.4 10*3/uL (ref 3.4–10.8)

## 2016-03-12 LAB — PROTEIN / CREATININE RATIO, URINE
CREATININE, UR: 93.5 mg/dL
PROTEIN UR: 47.1 mg/dL
Protein/Creat Ratio: 504 mg/g creat — ABNORMAL HIGH (ref 0–200)

## 2016-03-12 NOTE — Progress Notes (Signed)
Patient presented for blood pressure check. Patient blood pressure good. Reviewed signs and symptoms of Pre Ecamplsia with patient and instructed to go to hospital if she began feeling any of these. Patient states understanding. Kathrene Alu RNBSN

## 2016-03-15 ENCOUNTER — Telehealth: Payer: Self-pay

## 2016-03-15 NOTE — Telephone Encounter (Signed)
-----   Message from [name], DO sent at 03/15/2016  8:23 AM EST ----- Can you call patient in - she needs a 24 hour urine and another BP check

## 2016-03-15 NOTE — Telephone Encounter (Signed)
Attempted mobile phone and not accepting calls at this time. Kathrene Alu RNBSN

## 2016-03-19 ENCOUNTER — Ambulatory Visit (INDEPENDENT_AMBULATORY_CARE_PROVIDER_SITE_OTHER): Payer: Medicaid Other | Admitting: Family Medicine

## 2016-03-19 VITALS — BP 120/79 | HR 83 | Wt 220.0 lb

## 2016-03-19 DIAGNOSIS — O133 Gestational [pregnancy-induced] hypertension without significant proteinuria, third trimester: Secondary | ICD-10-CM

## 2016-03-19 DIAGNOSIS — O163 Unspecified maternal hypertension, third trimester: Secondary | ICD-10-CM

## 2016-03-19 DIAGNOSIS — Z3400 Encounter for supervision of normal first pregnancy, unspecified trimester: Secondary | ICD-10-CM

## 2016-03-19 NOTE — Progress Notes (Signed)
Patient given instruction and 24 hr urine container. [facility] Kathrene Alu RNBSN

## 2016-03-19 NOTE — Progress Notes (Signed)
   PRENATAL VISIT NOTE  Subjective:  Sue Miller is a 21 y.o. G1P0 at [redacted]w[redacted]d being seen today for ongoing prenatal care.  She is currently monitored for the following issues for this low-risk pregnancy and has Supervision of normal first pregnancy; Obesity in pregnancy; Late prenatal care; and Group B streptococcal carriage complicating pregnancy on her problem list.  Patient reports denies headache, vision changes, RUQ pain. .  Contractions: Irritability. Vag. Bleeding: None.  Movement: Present. Denies leaking of fluid.   The following portions of the patient's history were reviewed and updated as appropriate: allergies, current medications, past family history, past medical history, past social history, past surgical history and problem list. Problem list updated.  Objective:   Vitals:   03/19/16 0851  BP: 120/79  Pulse: 83  Weight: 220 lb (99.8 kg)    Fetal Status: Fetal Heart Rate (bpm): 135   Movement: Present     General:  Alert, oriented and cooperative. Patient is in no acute distress.  Skin: Skin is warm and dry. No rash noted.   Cardiovascular: Normal heart rate noted  Respiratory: Normal respiratory effort, no problems with respiration noted  Abdomen: Soft, gravid, appropriate for gestational age. Pain/Pressure: Present     Pelvic:  Cervical exam deferred        Extremities: Normal range of motion.  Edema: Trace  Mental Status: Normal mood and affect. Normal behavior. Normal judgment and thought content.   Assessment and Plan:  Pregnancy: G1P0 at [redacted]w[redacted]d  1. Supervision of normal first pregnancy, antepartum FHT and FH normal  2. Elevated blood pressure complicating pregnancy in third trimester, antepartum Normal today. Did have elevated proteinuria. Will watch closely.    Term labor symptoms and general obstetric precautions including but not limited to vaginal bleeding, contractions, leaking of fluid and fetal movement were reviewed in detail with the  patient. Please refer to After Visit Summary for other counseling recommendations.  No Follow-up on file.   [name], DO

## 2016-03-21 ENCOUNTER — Other Ambulatory Visit (HOSPITAL_COMMUNITY)
Admission: RE | Admit: 2016-03-21 | Discharge: 2016-03-21 | Disposition: A | Discharge status: Home / Self Care | Payer: Medicaid Other | Source: Ambulatory Visit | Attending: Family Medicine | Admitting: Family Medicine

## 2016-03-21 ENCOUNTER — Other Ambulatory Visit: Payer: Self-pay | Admitting: Advanced Practice Midwife

## 2016-03-21 DIAGNOSIS — O163 Unspecified maternal hypertension, third trimester: Secondary | ICD-10-CM

## 2016-03-21 NOTE — Progress Notes (Signed)
Here for outpatient 24 hour urine.

## 2016-03-23 ENCOUNTER — Telehealth: Payer: Self-pay

## 2016-03-23 NOTE — Telephone Encounter (Signed)
[facility] and stated that they are unable to run the patients 24 hour urine that was turned in on Sunday.   Made them aware that [place] placed orders on 03-21-16  Through [facility] and they didn't run the lab due to not having the orders. Dr. Nehemiah Settle placed orders yesterday 03-22-16 and faxed to lab because patient called stating that the lab needed orders.  After speaking with lab tech- she states that her manager does not think it is appropriate to run the 24 hour urine today since it has sat for 2 days.  I inquired if they will call the patient and she states no. Patient will need to recollect 24 hour urine. I asked lab tech why this was not run because we have a doctor on call at all times on Labor and delivery and any of our APPs can order for our patients and V. Smith,CNM actually did this. Lab tech could not give me reason why this was not run this weekend when it was turned in.  Attempted to reach patient to inform her and no answer. Kathrene Alu RNBSN

## 2016-03-24 ENCOUNTER — Encounter: Payer: Medicaid Other | Admitting: Obstetrics & Gynecology

## 2016-03-26 ENCOUNTER — Ambulatory Visit (INDEPENDENT_AMBULATORY_CARE_PROVIDER_SITE_OTHER): Payer: Medicaid Other | Admitting: Family Medicine

## 2016-03-26 ENCOUNTER — Encounter (HOSPITAL_COMMUNITY): Payer: Self-pay

## 2016-03-26 ENCOUNTER — Encounter: Payer: Self-pay | Admitting: Family Medicine

## 2016-03-26 ENCOUNTER — Inpatient Hospital Stay (HOSPITAL_COMMUNITY)
Admission: AD | Admit: 2016-03-26 | Discharge: 2016-03-30 | DRG: 775 | Disposition: A | Discharge status: Home / Self Care | Payer: Medicaid Other | Source: Ambulatory Visit | Attending: Obstetrics & Gynecology | Admitting: Obstetrics & Gynecology

## 2016-03-26 VITALS — BP 143/87 | HR 93 | Wt 225.0 lb

## 2016-03-26 DIAGNOSIS — O1493 Unspecified pre-eclampsia, third trimester: Secondary | ICD-10-CM | POA: Diagnosis present

## 2016-03-26 DIAGNOSIS — Z3A39 39 weeks gestation of pregnancy: Secondary | ICD-10-CM

## 2016-03-26 DIAGNOSIS — O99214 Obesity complicating childbirth: Secondary | ICD-10-CM | POA: Diagnosis present

## 2016-03-26 DIAGNOSIS — Z6839 Body mass index (BMI) 39.0-39.9, adult: Secondary | ICD-10-CM

## 2016-03-26 DIAGNOSIS — O1494 Unspecified pre-eclampsia, complicating childbirth: Secondary | ICD-10-CM | POA: Diagnosis present

## 2016-03-26 DIAGNOSIS — Z3403 Encounter for supervision of normal first pregnancy, third trimester: Secondary | ICD-10-CM

## 2016-03-26 DIAGNOSIS — O99824 Streptococcus B carrier state complicating childbirth: Secondary | ICD-10-CM | POA: Diagnosis present

## 2016-03-26 LAB — PROTEIN / CREATININE RATIO, URINE
Creatinine, Urine: 70 mg/dL
PROTEIN CREATININE RATIO: 6.2 mg/mg{creat} — AB (ref 0.00–0.15)
Total Protein, Urine: 434 mg/dL

## 2016-03-26 LAB — COMPREHENSIVE METABOLIC PANEL
ALT: 15 U/L (ref 14–54)
AST: 22 U/L (ref 15–41)
Albumin: 2.9 g/dL — ABNORMAL LOW (ref 3.5–5.0)
Alkaline Phosphatase: 249 U/L — ABNORMAL HIGH (ref 38–126)
Anion gap: 11 (ref 5–15)
BUN: 7 mg/dL (ref 6–20)
CHLORIDE: 104 mmol/L (ref 101–111)
CO2: 20 mmol/L — AB (ref 22–32)
Calcium: 9.1 mg/dL (ref 8.9–10.3)
Creatinine, Ser: 0.62 mg/dL (ref 0.44–1.00)
GFR calc Af Amer: >60 mL/min (ref >60)
GFR calc non Af Amer: >60 mL/min (ref >60)
Glucose, Bld: 73 mg/dL (ref 65–99)
Potassium: 4 mmol/L (ref 3.5–5.1)
SODIUM: 135 mmol/L (ref 135–145)
Total Bilirubin: 0.3 mg/dL (ref 0.3–1.2)
Total Protein: 6.7 g/dL (ref 6.5–8.1)

## 2016-03-26 LAB — CBC
HEMATOCRIT: 29.5 % — AB (ref 36.0–46.0)
Hemoglobin: 9.8 g/dL — ABNORMAL LOW (ref 12.0–15.0)
MCH: 29.6 pg (ref 26.0–34.0)
MCHC: 33.2 g/dL (ref 30.0–36.0)
MCV: 89.1 fL (ref 78.0–100.0)
Platelets: 261 10*3/uL (ref 150–400)
RBC: 3.31 MIL/uL — ABNORMAL LOW (ref 3.87–5.11)
RDW: 15 % (ref 11.5–15.5)
WBC: 8.5 10*3/uL (ref 4.0–10.5)

## 2016-03-26 LAB — TYPE AND SCREEN
ABO/RH(D): O POS
Antibody Screen: NEGATIVE

## 2016-03-26 LAB — ABO/RH: ABO/RH(D): O POS

## 2016-03-26 MED ORDER — PENICILLIN G POTASSIUM 5000000 UNITS IJ SOLR
5.0000 10*6.[IU] | Freq: Once | INTRAMUSCULAR | Status: AC
  Administered 2016-03-26: 5 10*6.[IU] via INTRAVENOUS
  Filled 2016-03-26: qty 5

## 2016-03-26 MED ORDER — TERBUTALINE SULFATE 1 MG/ML IJ SOLN
0.2500 mg | Freq: Once | INTRAMUSCULAR | Status: DC | PRN
  Filled 2016-03-26: qty 1

## 2016-03-26 MED ORDER — LACTATED RINGERS IV SOLN
500.0000 mL | INTRAVENOUS | Status: DC | PRN
  Administered 2016-03-27: 250 mL via INTRAVENOUS
  Administered 2016-03-27: 500 mL via INTRAVENOUS

## 2016-03-26 MED ORDER — PENICILLIN G POT IN DEXTROSE 60000 UNIT/ML IV SOLN
3.0000 10*6.[IU] | INTRAVENOUS | Status: DC
  Administered 2016-03-26 – 2016-03-27 (×8): 3 10*6.[IU] via INTRAVENOUS
  Filled 2016-03-26 (×12): qty 50

## 2016-03-26 MED ORDER — FENTANYL CITRATE (PF) 100 MCG/2ML IJ SOLN
50.0000 ug | INTRAMUSCULAR | Status: DC | PRN
  Administered 2016-03-26: 50 ug via INTRAVENOUS
  Administered 2016-03-26: 100 ug via INTRAVENOUS
  Filled 2016-03-26 (×2): qty 2

## 2016-03-26 MED ORDER — ONDANSETRON HCL 4 MG/2ML IJ SOLN
4.0000 mg | Freq: Four times a day (QID) | INTRAMUSCULAR | Status: DC | PRN
  Administered 2016-03-27: 4 mg via INTRAVENOUS
  Filled 2016-03-26: qty 2

## 2016-03-26 MED ORDER — OXYTOCIN 40 UNITS IN LACTATED RINGERS INFUSION - SIMPLE MED: 2.5000 [IU]/h | INTRAVENOUS | Status: DC

## 2016-03-26 MED ORDER — FLEET ENEMA 7-19 GM/118ML RE ENEM: 1.0000 | ENEMA | RECTAL | Status: DC | PRN

## 2016-03-26 MED ORDER — PROMETHAZINE HCL 25 MG/ML IJ SOLN: 25.0000 mg | Freq: Once | INTRAMUSCULAR | Status: DC

## 2016-03-26 MED ORDER — OXYCODONE-ACETAMINOPHEN 5-325 MG PO TABS: 1.0000 | ORAL_TABLET | ORAL | Status: DC | PRN

## 2016-03-26 MED ORDER — LIDOCAINE HCL (PF) 1 % IJ SOLN
30.0000 mL | INTRAMUSCULAR | Status: AC | PRN
  Administered 2016-03-28: 30 mL via SUBCUTANEOUS
  Filled 2016-03-26: qty 30

## 2016-03-26 MED ORDER — OXYTOCIN BOLUS FROM INFUSION
500.0000 mL | Freq: Once | INTRAVENOUS | Status: AC
  Administered 2016-03-28: 500 mL via INTRAVENOUS

## 2016-03-26 MED ORDER — SOD CITRATE-CITRIC ACID 500-334 MG/5ML PO SOLN: 30.0000 mL | ORAL | Status: DC | PRN

## 2016-03-26 MED ORDER — BUTORPHANOL TARTRATE 2 MG/ML IJ SOLN
2.0000 mg | INTRAMUSCULAR | Status: DC | PRN
  Administered 2016-03-27: 2 mg via INTRAVENOUS
  Filled 2016-03-26: qty 2

## 2016-03-26 MED ORDER — MISOPROSTOL 25 MCG QUARTER TABLET
25.0000 ug | ORAL_TABLET | ORAL | Status: DC | PRN
  Administered 2016-03-26 (×2): 25 ug via VAGINAL
  Filled 2016-03-26: qty 1
  Filled 2016-03-26 (×2): qty 0.25

## 2016-03-26 MED ORDER — ACETAMINOPHEN 325 MG PO TABS: 650.0000 mg | ORAL_TABLET | ORAL | Status: DC | PRN

## 2016-03-26 MED ORDER — OXYCODONE-ACETAMINOPHEN 5-325 MG PO TABS: 2.0000 | ORAL_TABLET | ORAL | Status: DC | PRN

## 2016-03-26 MED ORDER — LACTATED RINGERS IV SOLN
INTRAVENOUS | Status: DC
  Administered 2016-03-26 – 2016-03-27 (×6): via INTRAVENOUS

## 2016-03-26 NOTE — Patient Instructions (Addendum)
Having a circumcision done in the hospital costs approximately $480.  This will have to be paid in full prior to circumcision being performed.  There are places to have circumcision done as an outpatient which are cheaper.    Circumcisions      Provider   Phone    Price     ------------------------------------------------------------------------------   [facility]  [phone]  $480 by 4 wks     [facility]   [phone]  $[phone] by 4 wks     [facility]   [phone]  $175 by 2 wks    [facility]   [phone]  $250 by 7 days [facility]   [phone]  $150 by 4 wks   Third Trimester of Pregnancy The third trimester is from week 28 through week 40 (months 7 through 9). The third trimester is a time when the unborn baby (fetus) is growing rapidly. At the end of the ninth month, the fetus is about 20 inches in length and weighs 6-10 pounds. Body changes during your third trimester Your body will continue to go through many changes during pregnancy. The changes vary from woman to woman. During the third trimester:  Your weight will continue to increase. You can expect to gain 25-35 pounds (11-16 kg) by the end of the pregnancy.  You may begin to get stretch marks on your hips, abdomen, and breasts.  You may urinate more often because the fetus is moving lower into your pelvis and pressing on your bladder.  You may develop or continue to have heartburn. This is caused by increased hormones that slow down muscles in the digestive tract.  You may develop or continue to have constipation because increased hormones slow digestion and cause the muscles that push waste through your intestines to relax.  You may develop hemorrhoids. These are swollen veins (varicose veins) in the rectum that can itch or be painful.  You may develop swollen, bulging veins (varicose veins) in your legs.  You may have increased body aches in the pelvis, back, or thighs. This is due to weight gain and increased hormones that are relaxing your  joints.  You may have changes in your hair. These can include thickening of your hair, rapid growth, and changes in texture. Some women also have hair loss during or after pregnancy, or hair that feels dry or thin. Your hair will most likely return to normal after your baby is born.  Your breasts will continue to grow and they will continue to become tender. A yellow fluid (colostrum) may leak from your breasts. This is the first milk you are producing for your baby.  Your belly button may stick out.  You may notice more swelling in your hands, face, or ankles.  You may have increased tingling or numbness in your hands, arms, and legs. The skin on your belly may also feel numb.  You may feel short of breath because of your expanding uterus.  You may have more problems sleeping. This can be caused by the size of your belly, increased need to urinate, and an increase in your body's metabolism.  You may notice the fetus "dropping," or moving lower in your abdomen (lightening).  You may have increased vaginal discharge.  You may notice your joints feel loose and you may have pain around your pelvic bone. What to expect at prenatal visits You will have prenatal exams every 2 weeks until week 36. Then you will have weekly prenatal exams. During a routine prenatal visit:  You will be weighed to make sure you and the baby are growing normally.  Your blood pressure will be taken.  Your abdomen will be measured to track your baby's growth.  The fetal heartbeat will be listened to.  Any test results from the previous visit will be discussed.  You may have a cervical check near your due date to see if your cervix has softened or thinned (effaced).  You will be tested for Group B streptococcus. This happens between 35 and 37 weeks. Your health care provider may ask you:  What your birth plan is.  How you are feeling.  If you are feeling the baby move.  If you have had any abnormal  symptoms, such as leaking fluid, bleeding, severe headaches, or abdominal cramping.  If you are using any tobacco products, including cigarettes, chewing tobacco, and electronic cigarettes.  If you have any questions. Other tests or screenings that may be performed during your third trimester include:  Blood tests that check for low iron levels (anemia).  Fetal testing to check the health, activity level, and growth of the fetus. Testing is done if you have certain medical conditions or if there are problems during the pregnancy.  Nonstress test (NST). This test checks the health of your baby to make sure there are no signs of problems, such as the baby not getting enough oxygen. During this test, a belt is placed around your belly. The baby is made to move, and its heart rate is monitored during movement. What is false labor? False labor is a condition in which you feel small, irregular tightenings of the muscles in the womb (contractions) that usually go away with rest, changing position, or drinking water. These are called Braxton Hicks contractions. Contractions may last for hours, days, or even weeks before true labor sets in. If contractions come at regular intervals, become more frequent, increase in intensity, or become painful, you should see your health care provider. What are the signs of labor?  Abdominal cramps.  Regular contractions that start at 10 minutes apart and become stronger and more frequent with time.  Contractions that start on the top of the uterus and spread down to the lower abdomen and back.  Increased pelvic pressure and dull back pain.  A watery or bloody mucus discharge that comes from the vagina.  Leaking of amniotic fluid. This is also known as your "water breaking." It could be a slow trickle or a gush. Let your health care provider know if it has a color or strange odor. If you have any of these signs, call your health care provider right away, even if  it is before your due date. Follow these instructions at home: Medicines   Follow your health care provider's instructions regarding medicine use. Specific medicines may be either safe or unsafe to take during pregnancy.  Take a prenatal vitamin that contains at least 600 micrograms (mcg) of folic acid.  If you develop constipation, try taking a stool softener if your health care provider approves. Eating and drinking   Eat a balanced diet that includes fresh fruits and vegetables, whole grains, good sources of protein such as meat, eggs, or tofu, and low-fat dairy. Your health care provider will help you determine the amount of weight gain that is right for you.  Avoid raw meat and uncooked cheese. These carry germs that can cause birth defects in the baby.  If you have low calcium intake from food, talk to your health  care provider about whether you should take a daily calcium supplement.  Eat four or five small meals rather than three large meals a day.  Limit foods that are high in fat and processed sugars, such as fried and sweet foods.  To prevent constipation:  Drink enough fluid to keep your urine clear or pale yellow.  Eat foods that are high in fiber, such as fresh fruits and vegetables, whole grains, and beans. Activity   Exercise only as directed by your health care provider. Most women can continue their usual exercise routine during pregnancy. Try to exercise for 30 minutes at least 5 days a week. Stop exercising if you experience uterine contractions.  Avoid heavy lifting.  Do not exercise in extreme heat or humidity, or at high altitudes.  Wear low-heel, comfortable shoes.  Practice good posture.  You may continue to have sex unless your health care provider tells you otherwise. Relieving pain and discomfort   Take frequent breaks and rest with your legs elevated if you have leg cramps or low back pain.  Take warm sitz baths to soothe any pain or discomfort  caused by hemorrhoids. Use hemorrhoid cream if your health care provider approves.  Wear a good support bra to prevent discomfort from breast tenderness.  If you develop varicose veins:  Wear support pantyhose or compression stockings as told by your healthcare provider.  Elevate your feet for 15 minutes, 3-4 times a day. Prenatal care   Write down your questions. Take them to your prenatal visits.  Keep all your prenatal visits as told by your health care provider. This is important. Safety   Wear your seat belt at all times when driving.  Make a list of emergency phone numbers, including numbers for family, friends, the hospital, and police and fire departments. General instructions   Avoid cat litter boxes and soil used by cats. These carry germs that can cause birth defects in the baby. If you have a cat, ask someone to clean the litter box for you.  Do not travel far distances unless it is absolutely necessary and only with the approval of your health care provider.  Do not use hot tubs, steam rooms, or saunas.  Do not drink alcohol.  Do not use any products that contain nicotine or tobacco, such as cigarettes and e-cigarettes. If you need help quitting, ask your health care provider.  Do not use any medicinal herbs or unprescribed drugs. These chemicals affect the formation and growth of the baby.  Do not douche or use tampons or scented sanitary pads.  Do not cross your legs for long periods of time.  To prepare for the arrival of your baby:  Take prenatal classes to understand, practice, and ask questions about labor and delivery.  Make a trial run to the hospital.  Visit the hospital and tour the maternity area.  Arrange for maternity or paternity leave through employers.  Arrange for family and friends to take care of pets while you are in the hospital.  Purchase a rear-facing car seat and make sure you know how to install it in your car.  Pack your  hospital bag.  Prepare the baby's nursery. Make sure to remove all pillows and stuffed animals from the baby's crib to prevent suffocation.  Visit your dentist if you have not gone during your pregnancy. Use a soft toothbrush to brush your teeth and be gentle when you floss. Contact a health care provider if:  You are unsure if  you are in labor or if your water has broken.  You become dizzy.  You have mild pelvic cramps, pelvic pressure, or nagging pain in your abdominal area.  You have lower back pain.  You have persistent nausea, vomiting, or diarrhea.  You have an unusual or bad smelling vaginal discharge.  You have pain when you urinate. Get help right away if:  Your water breaks before 37 weeks.  You have regular contractions less than 5 minutes apart before 37 weeks.  You have a fever.  You are leaking fluid from your vagina.  You have spotting or bleeding from your vagina.  You have severe abdominal pain or cramping.  You have rapid weight loss or weight gain.  You have shortness of breath with chest pain.  You notice sudden or extreme swelling of your face, hands, ankles, feet, or legs.  Your baby makes fewer than 10 movements in 2 hours.  You have severe headaches that do not go away when you take medicine.  You have vision changes. Summary  The third trimester is from week 28 through week 40, months 7 through 9. The third trimester is a time when the unborn baby (fetus) is growing rapidly.  During the third trimester, your discomfort may increase as you and your baby continue to gain weight. You may have abdominal, leg, and back pain, sleeping problems, and an increased need to urinate.  During the third trimester your breasts will keep growing and they will continue to become tender. A yellow fluid (colostrum) may leak from your breasts. This is the first milk you are producing for your baby.  False labor is a condition in which you feel small,  irregular tightenings of the muscles in the womb (contractions) that eventually go away. These are called Braxton Hicks contractions. Contractions may last for hours, days, or even weeks before true labor sets in.  Signs of labor can include: abdominal cramps; regular contractions that start at 10 minutes apart and become stronger and more frequent with time; watery or bloody mucus discharge that comes from the vagina; increased pelvic pressure and dull back pain; and leaking of amniotic fluid. This information is not intended to replace advice given to you by your health care provider. Make sure you discuss any questions you have with your health care provider. Document Released: 12/29/2000 Document Revised: 06/12/2015 Document Reviewed: 03/07/2012 Elsevier Interactive Patient Education  2017 [employer].   Breastfeeding Deciding to breastfeed is one of the best choices you can make for you and your baby. A change in hormones during pregnancy causes your breast tissue to grow and increases the number and size of your milk ducts. These hormones also allow proteins, sugars, and fats from your blood supply to make breast milk in your milk-producing glands. Hormones prevent breast milk from being released before your baby is born as well as prompt milk flow after birth. Once breastfeeding has begun, thoughts of your baby, as well as his or her sucking or crying, can stimulate the release of milk from your milk-producing glands. Benefits of breastfeeding For Your Baby  Your first milk (colostrum) helps your baby's digestive system function better.  There are antibodies in your milk that help your baby fight off infections.  Your baby has a lower incidence of asthma, allergies, and sudden infant death syndrome.  The nutrients in breast milk are better for your baby than infant formulas and are designed uniquely for your baby's needs.  Breast milk improves your baby's  brain development.  Your baby  is less likely to develop other conditions, such as childhood obesity, asthma, or type 2 diabetes mellitus. For You  Breastfeeding helps to create a very special bond between you and your baby.  Breastfeeding is convenient. Breast milk is always available at the correct temperature and costs nothing.  Breastfeeding helps to burn calories and helps you lose the weight gained during pregnancy.  Breastfeeding makes your uterus contract to its prepregnancy size faster and slows bleeding (lochia) after you give birth.  Breastfeeding helps to lower your risk of developing type 2 diabetes mellitus, osteoporosis, and breast or ovarian cancer later in life. Signs that your baby is hungry Early Signs of Hunger  Increased alertness or activity.  Stretching.  Movement of the head from side to side.  Movement of the head and opening of the mouth when the corner of the mouth or cheek is stroked (rooting).  Increased sucking sounds, smacking lips, cooing, sighing, or squeaking.  Hand-to-mouth movements.  Increased sucking of fingers or hands. Late Signs of Hunger  Fussing.  Intermittent crying. Extreme Signs of Hunger  Signs of extreme hunger will require calming and consoling before your baby will be able to breastfeed successfully. Do not wait for the following signs of extreme hunger to occur before you initiate breastfeeding:  Restlessness.  A loud, strong cry.  Screaming. Breastfeeding basics  Breastfeeding Initiation  Find a comfortable place to sit or lie down, with your neck and back well supported.  Place a pillow or rolled up blanket under your baby to bring him or her to the level of your breast (if you are seated). Nursing pillows are specially designed to help support your arms and your baby while you breastfeed.  Make sure that your baby's abdomen is facing your abdomen.  Gently massage your breast. With your fingertips, massage from your chest wall toward your nipple  in a circular motion. This encourages milk flow. You may need to continue this action during the feeding if your milk flows slowly.  Support your breast with 4 fingers underneath and your thumb above your nipple. Make sure your fingers are well away from your nipple and your baby's mouth.  Stroke your baby's lips gently with your finger or nipple.  When your baby's mouth is open wide enough, quickly bring your baby to your breast, placing your entire nipple and as much of the colored area around your nipple (areola) as possible into your baby's mouth.  More areola should be visible above your baby's upper lip than below the lower lip.  Your baby's tongue should be between his or her lower gum and your breast.  Ensure that your baby's mouth is correctly positioned around your nipple (latched). Your baby's lips should create a seal on your breast and be turned out (everted).  It is common for your baby to suck about 2-3 minutes in order to start the flow of breast milk. Latching  Teaching your baby how to latch on to your breast properly is very important. An improper latch can cause nipple pain and decreased milk supply for you and poor weight gain in your baby. Also, if your baby is not latched onto your nipple properly, he or she may swallow some air during feeding. This can make your baby fussy. Burping your baby when you switch breasts during the feeding can help to get rid of the air. However, teaching your baby to latch on properly is still the best way to  prevent fussiness from swallowing air while breastfeeding. Signs that your baby has successfully latched on to your nipple:  Silent tugging or silent sucking, without causing you pain.  Swallowing heard between every 3-4 sucks.  Muscle movement above and in front of his or her ears while sucking. Signs that your baby has not successfully latched on to nipple:  Sucking sounds or smacking sounds from your baby while  breastfeeding.  Nipple pain. If you think your baby has not latched on correctly, slip your finger into the corner of your baby's mouth to break the suction and place it between your baby's gums. Attempt breastfeeding initiation again. Signs of Successful Breastfeeding  Signs from your baby:  A gradual decrease in the number of sucks or complete cessation of sucking.  Falling asleep.  Relaxation of his or her body.  Retention of a small amount of milk in his or her mouth.  Letting go of your breast by himself or herself. Signs from you:  Breasts that have increased in firmness, weight, and size 1-3 hours after feeding.  Breasts that are softer immediately after breastfeeding.  Increased milk volume, as well as a change in milk consistency and color by the fifth day of breastfeeding.  Nipples that are not sore, cracked, or bleeding. Signs That Your [employer] is Getting Enough Milk  Wetting at least 1-2 diapers during the first 24 hours after birth.  Wetting at least 5-6 diapers every 24 hours for the first week after birth. The urine should be clear or pale yellow by 5 days after birth.  Wetting 6-8 diapers every 24 hours as your baby continues to grow and develop.  At least 3 stools in a 24-hour period by age 67 days. The stool should be soft and yellow.  At least 3 stools in a 24-hour period by age 81 days. The stool should be seedy and yellow.  No loss of weight greater than 10% of birth weight during the first 9 days of age.  Average weight gain of 4-7 ounces (113-198 g) per week after age 37 days.  Consistent daily weight gain by age 38 days, without weight loss after the age of 2 weeks. After a feeding, your baby may spit up a small amount. This is common. Breastfeeding frequency and duration Frequent feeding will help you make more milk and can prevent sore nipples and breast engorgement. Breastfeed when you feel the need to reduce the fullness of your breasts or when your  baby shows signs of hunger. This is called "breastfeeding on demand." Avoid introducing a pacifier to your baby while you are working to establish breastfeeding (the first 4-6 weeks after your baby is born). After this time you may choose to use a pacifier. Research has shown that pacifier use during the first year of a baby's life decreases the risk of sudden infant death syndrome (SIDS). Allow your baby to feed on each breast as long as he or she wants. Breastfeed until your baby is finished feeding. When your baby unlatches or falls asleep while feeding from the first breast, offer the second breast. Because newborns are often sleepy in the first few weeks of life, you may need to awaken your baby to get him or her to feed. Breastfeeding times will vary from baby to baby. However, the following rules can serve as a guide to help you ensure that your baby is properly fed:  Newborns (babies 20 weeks of age or younger) may breastfeed every 1-3 hours.  Newborns should not go longer than 3 hours during the day or 5 hours during the night without breastfeeding.  You should breastfeed your baby a minimum of 8 times in a 24-hour period until you begin to introduce solid foods to your baby at around 10 months of age. Breast milk pumping Pumping and storing breast milk allows you to ensure that your baby is exclusively fed your breast milk, even at times when you are unable to breastfeed. This is especially important if you are going back to work while you are still breastfeeding or when you are not able to be present during feedings. Your lactation consultant can give you guidelines on how long it is safe to store breast milk. A breast pump is a machine that allows you to pump milk from your breast into a sterile bottle. The pumped breast milk can then be stored in a refrigerator or freezer. Some breast pumps are operated by hand, while others use electricity. Ask your lactation consultant which type will work  best for you. Breast pumps can be purchased, but some hospitals and breastfeeding support groups lease breast pumps on a monthly basis. A lactation consultant can teach you how to hand express breast milk, if you prefer not to use a pump. Caring for your breasts while you breastfeed Nipples can become dry, cracked, and sore while breastfeeding. The following recommendations can help keep your breasts moisturized and healthy:  Avoid using soap on your nipples.  Wear a supportive bra. Although not required, special nursing bras and tank tops are designed to allow access to your breasts for breastfeeding without taking off your entire bra or top. Avoid wearing underwire-style bras or extremely tight bras.  Air dry your nipples for 3-24minutes after each feeding.  Use only cotton bra pads to absorb leaked breast milk. Leaking of breast milk between feedings is normal.  Use lanolin on your nipples after breastfeeding. Lanolin helps to maintain your skin's normal moisture barrier. If you use pure lanolin, you do not need to wash it off before feeding your baby again. Pure lanolin is not toxic to your baby. You may also hand express a few drops of breast milk and gently massage that milk into your nipples and allow the milk to air dry. In the first few weeks after giving birth, some women experience extremely full breasts (engorgement). Engorgement can make your breasts feel heavy, warm, and tender to the touch. Engorgement peaks within 3-5 days after you give birth. The following recommendations can help ease engorgement:  Completely empty your breasts while breastfeeding or pumping. You may want to start by applying warm, moist heat (in the shower or with warm water-soaked hand towels) just before feeding or pumping. This increases circulation and helps the milk flow. If your baby does not completely empty your breasts while breastfeeding, pump any extra milk after he or she is finished.  Wear a snug bra  (nursing or regular) or tank top for 1-2 days to signal your body to slightly decrease milk production.  Apply ice packs to your breasts, unless this is too uncomfortable for you.  Make sure that your baby is latched on and positioned properly while breastfeeding. If engorgement persists after 48 hours of following these recommendations, contact your health care provider or a Science writer. Overall health care recommendations while breastfeeding  Eat healthy foods. Alternate between meals and snacks, eating 3 of each per day. Because what you eat affects your breast milk, some of the foods  may make your baby more irritable than usual. Avoid eating these foods if you are sure that they are negatively affecting your baby.  Drink milk, fruit juice, and water to satisfy your thirst (about 10 glasses a day).  Rest often, relax, and continue to take your prenatal vitamins to prevent fatigue, stress, and anemia.  Continue breast self-awareness checks.  Avoid chewing and smoking tobacco. Chemicals from cigarettes that pass into breast milk and exposure to secondhand smoke may harm your baby.  Avoid alcohol and drug use, including marijuana. Some medicines that may be harmful to your baby can pass through breast milk. It is important to ask your health care provider before taking any medicine, including all over-the-counter and prescription medicine as well as vitamin and herbal supplements. It is possible to become pregnant while breastfeeding. If birth control is desired, ask your health care provider about options that will be safe for your baby. Contact a health care provider if:  You feel like you want to stop breastfeeding or have become frustrated with breastfeeding.  You have painful breasts or nipples.  Your nipples are cracked or bleeding.  Your breasts are red, tender, or warm.  You have a swollen area on either breast.  You have a fever or chills.  You have nausea or  vomiting.  You have drainage other than breast milk from your nipples.  Your breasts do not become full before feedings by the fifth day after you give birth.  You feel sad and depressed.  Your baby is too sleepy to eat well.  Your baby is having trouble sleeping.  Your baby is wetting less than 3 diapers in a 24-hour period.  Your baby has less than 3 stools in a 24-hour period.  Your baby's skin or the white part of his or her eyes becomes yellow.  Your baby is not gaining weight by 59 days of age. Get help right away if:  Your baby is overly tired (lethargic) and does not want to wake up and feed.  Your baby develops an unexplained fever. This information is not intended to replace advice given to you by your health care provider. Make sure you discuss any questions you have with your health care provider. Document Released: 01/04/2005 Document Revised: 06/18/2015 Document Reviewed: 06/28/2012 Elsevier Interactive Patient Education  2017 [employer].

## 2016-03-26 NOTE — Anesthesia Pain Management Evaluation Note (Signed)
  CRNA Pain Management Visit Note  Patient: Sue Miller, 20 y.o., female  "Hello I am a member of the anesthesia team at [facility]. We have an anesthesia team available at all times to provide care throughout the hospital, including epidural management and anesthesia for C-section. I don't know your plan for the delivery whether it a natural birth, water birth, IV sedation, nitrous supplementation, doula or epidural, but we want to meet your pain goals."   1.Was your pain managed to your expectations on prior hospitalizations?   No prior hospitalizations  2.What is your expectation for pain management during this hospitalization?     Epidural  3.How can we help you reach that goal? Support prn  Record the patient's initial score and the patient's pain goal.   Pain: 0  Pain Goal: 4 The [facility] wants you to be able to say your pain was always managed very well.  [facility] 03/26/2016

## 2016-03-26 NOTE — Progress Notes (Signed)
   PRENATAL VISIT NOTE  Subjective:  Sue Miller is a 21 y.o. G1P0 at [redacted]w[redacted]d being seen today for ongoing prenatal care.  She is currently monitored for the following issues for this low-risk pregnancy and has Supervision of normal first pregnancy; Obesity in pregnancy; Late prenatal care; and Group B streptococcal carriage complicating pregnancy on her problem list.  Patient reports no complaints. Denies headache, vision changes, RUQ pain.  Contractions: Irritability. Vag. Bleeding: None.  Movement: Present. Denies leaking of fluid.   The following portions of the patient's history were reviewed and updated as appropriate: allergies, current medications, past family history, past medical history, past social history, past surgical history and problem list. Problem list updated.  Objective:   Vitals:   03/26/16 0951  BP: (!) 143/87  Pulse: 93  Weight: 225 lb (102.1 kg)    Fetal Status: Fetal Heart Rate (bpm): 129 Fundal Height: 37 cm Movement: Present  Presentation: Vertex  General:  Alert, oriented and cooperative. Patient is in no acute distress.  Skin: Skin is warm and dry. No rash noted.   Cardiovascular: Normal heart rate noted  Respiratory: Normal respiratory effort, no problems with respiration noted  Abdomen: Soft, gravid, appropriate for gestational age. Pain/Pressure: Present     Pelvic:  Cervical exam performed Dilation: Fingertip Effacement (%): 50    Extremities: Normal range of motion.  Edema: Mild pitting, slight indentation  Mental Status: Normal mood and affect. Normal behavior. Normal judgment and thought content.   Assessment and Plan:  Pregnancy: G1P0 at 102w4d  1. Encounter for supervision of normal first pregnancy in third trimester  2. Preeclampsia 2nd elevated BP and 3+ protein today--for IOL Orders placed.  Term labor symptoms and general obstetric precautions including but not limited to vaginal bleeding, contractions, leaking of fluid and fetal  movement were reviewed in detail with the patient. Please refer to After Visit Summary for other counseling recommendations.  Return in 6 weeks (on 05/07/2016) for pp check.   Donnamae Jude, MD

## 2016-03-26 NOTE — H&P (Signed)
Sue Miller is a 21 y.o. female presenting for IOL for preeclampsia. BP were elevated in the office today and protein 3+. Low risk, GBS pos, late to prenatal care.Patient reports no complaints. Denies headache, vision changes, RUQ pain.  Contractions: Irritability. Vag. Bleeding: None.  Movement: Present. Denies leaking of fluid.  OB History    Gravida Para Term Preterm AB Living   1             SAB TAB Ectopic Multiple Live Births                 History reviewed. No pertinent past medical history. Past Surgical History:  Procedure Laterality Date  . NO PAST SURGERIES     Family History: family history is not on file. She was adopted. Social History:  reports that she has never smoked. She has never used smokeless tobacco. She reports that she does not drink alcohol or use drugs.  Clinic  HP Prenatal Labs  Dating  25 week bedside u/s Blood type: O/POS/-- (11/30 0925) O+  Genetic Screen  too late Antibody:NEG (11/30 9326)  Anatomic [place]  Normal Rubella: 1.75 (11/30 0925)  GTT Third trimester: WNL RPR: NON REAC (12/14 0821)   Flu vaccine  12/05/15 HBsAg: NEGATIVE (11/30 0925)   TDaP vaccine     01/17/16                                          HIV: NONREACTIVE (12/14 0821)   Baby Food Breast                                           GBS: Positive  Contraception  ?POPs Pap: 11/17 negative  Circumcision desires   Pediatrician    Support Person  Boyfriend Vonna Kotyk        Maternal Diabetes: No Genetic Screening: too late, not performed Maternal Ultrasounds/Referrals: Normal Fetal Ultrasounds or other Referrals:  None Maternal Substance Abuse:  No Significant Maternal Medications:  None Significant Maternal Lab Results:   Hbg 9.8, GBS positive Other Comments:  None  Review of Systems  Constitutional: Negative for fever.  Eyes: Negative for blurred vision and double vision.  Respiratory: Negative for shortness of breath.   Cardiovascular: Negative for chest pain and leg swelling.   Gastrointestinal: Negative for abdominal pain, nausea and vomiting.  Genitourinary: Negative for dysuria.  Neurological: Negative for dizziness and headaches.   Maternal Medical History:  Reason for admission: Nausea.      Temperature 98.4 F (36.9 C), temperature source Oral, height 5\' 3"  (1.6 m), weight 225 lb (102.1 kg), last menstrual period 06/23/2015. Exam Physical Exam  Constitutional: She is oriented to person, place, and time. She appears well-developed and well-nourished. No distress.  HENT:  Head: Normocephalic and atraumatic.  Eyes: Conjunctivae and EOM are normal. Pupils are equal, round, and reactive to light.  Respiratory: Effort normal. No respiratory distress.  GI: Soft. She exhibits no distension. There is no tenderness. There is no rebound.  Gravid   Genitourinary: Vagina normal.  Neurological: She is alert and oriented to person, place, and time. No cranial nerve deficit.  Skin: Skin is warm and dry. No rash noted. She is not diaphoretic. No erythema. No pallor.  Psychiatric: She has a normal mood and affect.  Her behavior is normal.    Prenatal labs: ABO, Rh: O/POS/-- (11/30 0925) Antibody: NEG (11/30 0925) Rubella: 1.75 (11/30 0925) RPR: NON REAC (12/14 0821)  HBsAg: NEGATIVE (11/30 0925)  HIV: NONREACTIVE (12/14 0821)  GBS:     Assessment/Plan: Admit for IOL 2/2 preeclampsia  #LABOR: cytotec placed at 1400; FB to be placed #ID: PCN for GBS pos  #MOF: breast #MOC: POPs #Circ: outpatient   Allene Pyo do Lorra Hals MD PGY1 03/26/2016, 1:35 PM  OB FELLOW HISTORY AND PHYSICAL ATTESTATION  I have seen and examined this patient; I agree with above documentation in the resident's note.    Katherine Basset, DO OB Fellow

## 2016-03-27 ENCOUNTER — Encounter (HOSPITAL_COMMUNITY): Payer: Self-pay | Admitting: Anesthesiology

## 2016-03-27 ENCOUNTER — Inpatient Hospital Stay (HOSPITAL_COMMUNITY): Payer: Medicaid Other | Admitting: Anesthesiology

## 2016-03-27 LAB — CBC
HCT: 29.6 % — ABNORMAL LOW (ref 36.0–46.0)
HEMOGLOBIN: 9.3 g/dL — AB (ref 12.0–15.0)
MCH: 28.4 pg (ref 26.0–34.0)
MCHC: 31.4 g/dL (ref 30.0–36.0)
MCV: 90.5 fL (ref 78.0–100.0)
PLATELETS: 258 10*3/uL (ref 150–400)
RBC: 3.27 MIL/uL — AB (ref 3.87–5.11)
RDW: 14.9 % (ref 11.5–15.5)
WBC: 10.6 10*3/uL — ABNORMAL HIGH (ref 4.0–10.5)

## 2016-03-27 LAB — RPR: RPR: NONREACTIVE

## 2016-03-27 MED ORDER — LACTATED RINGERS IV SOLN
500.0000 mL | Freq: Once | INTRAVENOUS | Status: AC
  Administered 2016-03-27: 500 mL via INTRAVENOUS

## 2016-03-27 MED ORDER — EPHEDRINE 5 MG/ML INJ
10.0000 mg | INTRAVENOUS | Status: DC | PRN
  Filled 2016-03-27: qty 4

## 2016-03-27 MED ORDER — LIDOCAINE HCL (PF) 1 % IJ SOLN
INTRAMUSCULAR | Status: DC | PRN
  Administered 2016-03-27: 3 mL via EPIDURAL
  Administered 2016-03-27: 5 mL via EPIDURAL
  Administered 2016-03-27: 3 mL via EPIDURAL

## 2016-03-27 MED ORDER — DIPHENHYDRAMINE HCL 50 MG/ML IJ SOLN: 12.5000 mg | INTRAMUSCULAR | Status: DC | PRN

## 2016-03-27 MED ORDER — FENTANYL 2.5 MCG/ML BUPIVACAINE 1/10 % EPIDURAL INFUSION (WH - ANES)
14.0000 mL/h | INTRAMUSCULAR | Status: DC | PRN
  Administered 2016-03-27: 14 mL/h via EPIDURAL
  Administered 2016-03-27: 6 mL/h via EPIDURAL
  Filled 2016-03-27: qty 100

## 2016-03-27 MED ORDER — PHENYLEPHRINE 40 MCG/ML (10ML) SYRINGE FOR IV PUSH (FOR BLOOD PRESSURE SUPPORT)
80.0000 ug | PREFILLED_SYRINGE | INTRAVENOUS | Status: DC | PRN
  Filled 2016-03-27: qty 5

## 2016-03-27 MED ORDER — OXYTOCIN 40 UNITS IN LACTATED RINGERS INFUSION - SIMPLE MED
1.0000 m[IU]/min | INTRAVENOUS | Status: DC
  Administered 2016-03-27: 2 m[IU]/min via INTRAVENOUS
  Administered 2016-03-28: 9 m[IU]/min via INTRAVENOUS
  Filled 2016-03-27: qty 1000

## 2016-03-27 MED ORDER — PHENYLEPHRINE 40 MCG/ML (10ML) SYRINGE FOR IV PUSH (FOR BLOOD PRESSURE SUPPORT)
80.0000 ug | PREFILLED_SYRINGE | INTRAVENOUS | Status: DC | PRN
  Filled 2016-03-27: qty 5
  Filled 2016-03-27: qty 10

## 2016-03-27 NOTE — Progress Notes (Signed)
Patient managing contractions well   Dilation: Fingertip Effacement (%): 60 Cervical Position: Posterior Station: -3 Presentation: Vertex Exam by:: Mumaw, MD  IOL for preE - foley bulb continues to be in place, traction applied   Stadol IV given for pain   I have seen and examined this patient and I agree with the above note by Darrol Poke, SNM Serita Grammes CNM 10:49 PM 03/31/2016

## 2016-03-27 NOTE — Progress Notes (Signed)
Denies headache, RUQ pain, dizziness, N/V, leg swelling, vision changes. Comfortable. Feeling contractions. No epidural  BP 123/81   Pulse 95   Temp 99 F (37.2 C) (Oral)   Resp 20   Ht 5\' 3"  (1.6 m)   Wt 225 lb (102.1 kg)   LMP 06/23/2015   BMI 39.86 kg/m  SVE 5/60/-2 @1015  Fetal monitoring: basleine 130, mode variab, +acels no decels  A progressing, npreeclampsia no signs of severe features P continue labor, expect vaginal delivery  Flora Lipps MD PGY1

## 2016-03-27 NOTE — Anesthesia Preprocedure Evaluation (Signed)
Anesthesia Evaluation  Patient identified by MRN, date of birth, ID band Patient awake    Reviewed: Allergy & Precautions, H&P , NPO status , Patient's Chart, lab work & pertinent test results  Airway Mallampati: II  TM Distance: >3 FB Neck ROM: full    Dental no notable dental hx.    Pulmonary neg pulmonary ROS,    Pulmonary exam normal        Cardiovascular negative cardio ROS Normal cardiovascular exam     Neuro/Psych negative neurological ROS  negative psych ROS   GI/Hepatic negative GI ROS, Neg liver ROS,   Endo/Other  Morbid obesity  Renal/GU negative Renal ROS     Musculoskeletal   Abdominal (+) + obese,   Peds  Hematology negative hematology ROS (+)   Anesthesia Other Findings   Reproductive/Obstetrics (+) Pregnancy                             Anesthesia Physical Anesthesia Plan  ASA: III  Anesthesia Plan: Epidural   Post-op Pain Management:    Induction:   Airway Management Planned:   Additional Equipment:   Intra-op Plan:   Post-operative Plan:   Informed Consent: I have reviewed the patients History and Physical, chart, labs and discussed the procedure including the risks, benefits and alternatives for the proposed anesthesia with the patient or authorized representative who has indicated his/her understanding and acceptance.     Plan Discussed with:   Anesthesia Plan Comments:         Anesthesia Quick Evaluation

## 2016-03-27 NOTE — Progress Notes (Signed)
Feeling contractions, comfortable. On Pit since 0800  BP (!) 146/87   Pulse (!) 103   Temp 98.8 F (37.1 C) (Oral)   Resp 18   Ht 5\' 3"  (1.6 m)   Wt 225 lb (102.1 kg)   LMP 06/23/2015   BMI 39.86 kg/m  SBPs 129-146 SVE 5/60/-2, vertex  A progressing  P expect vaginal delivery

## 2016-03-27 NOTE — Progress Notes (Signed)
S: Had one episode of emesis. Otherwise no complaints.  No headaches, dizziness, blurred vision, RUA pain or leg swelling.  O: No BP in severe ranges BP (!) 149/97   Pulse 63   Temp 98.7 F (37.1 C) (Oral)   Resp 20   Ht 5\' 3"  (1.6 m)   Wt 225 lb (102.1 kg)   LMP 06/23/2015   SpO2 100%   BMI 39.86 kg/m  Fetal monitoring: baseline 130, low variability SVE 6-7/80/-1  A progressing , no severe features signs of preeclampsia  P IUPC placed Reposition mother O2 Continue to monitor  Flora Lipps MD PGY1

## 2016-03-27 NOTE — Progress Notes (Signed)
Comfortable, still no signs of severe features   BP (!) 157/105   Pulse 96   Temp 98.7 F (37.1 C) (Oral)   Resp 20   Ht 5\' 3"  (1.6 m)   Wt 225 lb (102.1 kg)   LMP 06/23/2015   BMI 39.86 kg/m  SVE: 5/80/-2 Fetal monitoring: 130, mod variability, + acels, no decels Max BP was 157/105  Progressing  AROM @1623 , clear, large amount of fluid  P expect vaginal delivery  Allene Pyo do Lorra Hals MD PGY1

## 2016-03-27 NOTE — Progress Notes (Signed)
Denies headaches, dizzines, blurred vision, leg swelling, RUQ pain  BP (!) 141/83   Pulse 78   Temp 98.4 F (36.9 C) (Oral)   Resp 16   Ht 5\' 3"  (1.6 m)   Wt 225 lb (102.1 kg)   LMP 06/23/2015   SpO2 100%   BMI 39.86 kg/m  SVE 9.5/90/0 Fetal monitoring: baseline 135, moderate variability, no acels, no decels Improved after pitocyn stopped and maneuvers performed  P restar pitocyn at half previous rate (6)  Allene Pyo do Lorra Hals, MD PGY1

## 2016-03-27 NOTE — Anesthesia Procedure Notes (Signed)
Epidural Patient location during procedure: OB Start time: 03/27/2016 4:58 PM End time: 03/27/2016 5:02 PM  Staffing Anesthesiologist: Lyn Hollingshead Performed: anesthesiologist   Preanesthetic Checklist Completed: patient identified, surgical consent, pre-op evaluation, timeout performed, IV checked, risks and benefits discussed and monitors and equipment checked  Epidural Patient position: sitting Prep: site prepped and draped and DuraPrep Patient monitoring: continuous pulse ox and blood pressure Approach: midline Location: L3-L4 Injection technique: LOR air  Needle:  Needle type: Tuohy  Needle gauge: 17 G Needle length: 9 cm and 9 Needle insertion depth: 6 cm Catheter type: closed end flexible Catheter size: 19 Gauge Catheter at skin depth: 9 cm Test dose: positive and Other  Assessment Sensory level: T10 Events: blood not aspirated, injection not painful, no injection resistance, negative IV test and no paresthesia  Additional Notes Catheter pulled back to -CSF and threaded.Reason for block:procedure for pain

## 2016-03-28 ENCOUNTER — Encounter (HOSPITAL_COMMUNITY): Payer: Self-pay

## 2016-03-28 DIAGNOSIS — O1494 Unspecified pre-eclampsia, complicating childbirth: Secondary | ICD-10-CM

## 2016-03-28 DIAGNOSIS — O99824 Streptococcus B carrier state complicating childbirth: Secondary | ICD-10-CM

## 2016-03-28 DIAGNOSIS — Z3A39 39 weeks gestation of pregnancy: Secondary | ICD-10-CM

## 2016-03-28 MED ORDER — TETANUS-DIPHTH-ACELL PERTUSSIS 5-2.5-18.5 LF-MCG/0.5 IM SUSP: 0.5000 mL | Freq: Once | INTRAMUSCULAR | Status: DC

## 2016-03-28 MED ORDER — DIPHENHYDRAMINE HCL 25 MG PO CAPS: 25.0000 mg | ORAL_CAPSULE | Freq: Four times a day (QID) | ORAL | Status: DC | PRN

## 2016-03-28 MED ORDER — DOCUSATE SODIUM 100 MG PO CAPS
100.0000 mg | ORAL_CAPSULE | Freq: Every day | ORAL | Status: DC
  Administered 2016-03-28 – 2016-03-29 (×2): 100 mg via ORAL
  Filled 2016-03-28 (×2): qty 1

## 2016-03-28 MED ORDER — PRENATAL MULTIVITAMIN CH
1.0000 | ORAL_TABLET | Freq: Every day | ORAL | Status: DC
  Administered 2016-03-28 – 2016-03-29 (×2): 1 via ORAL
  Filled 2016-03-28 (×2): qty 1

## 2016-03-28 MED ORDER — WITCH HAZEL-GLYCERIN EX PADS: 1.0000 "application " | MEDICATED_PAD | CUTANEOUS | Status: DC | PRN

## 2016-03-28 MED ORDER — ZOLPIDEM TARTRATE 5 MG PO TABS
5.0000 mg | ORAL_TABLET | Freq: Every evening | ORAL | Status: DC | PRN
Start: 2016-03-28 — End: 2016-03-30

## 2016-03-28 MED ORDER — DIBUCAINE 1 % RE OINT: 1.0000 "application " | TOPICAL_OINTMENT | RECTAL | Status: DC | PRN

## 2016-03-28 MED ORDER — BENZOCAINE-MENTHOL 20-0.5 % EX AERO
1.0000 "application " | INHALATION_SPRAY | CUTANEOUS | Status: DC | PRN
  Administered 2016-03-28: 1 via TOPICAL
  Filled 2016-03-28 (×2): qty 56

## 2016-03-28 MED ORDER — POLYETHYLENE GLYCOL 3350 17 G PO PACK
17.0000 g | PACK | Freq: Every day | ORAL | Status: DC
  Administered 2016-03-28 – 2016-03-29 (×2): 17 g via ORAL
  Filled 2016-03-28 (×4): qty 1

## 2016-03-28 MED ORDER — SIMETHICONE 80 MG PO CHEW: 80.0000 mg | CHEWABLE_TABLET | ORAL | Status: DC | PRN

## 2016-03-28 MED ORDER — ONDANSETRON HCL 4 MG PO TABS: 4.0000 mg | ORAL_TABLET | ORAL | Status: DC | PRN

## 2016-03-28 MED ORDER — SENNOSIDES-DOCUSATE SODIUM 8.6-50 MG PO TABS
2.0000 | ORAL_TABLET | ORAL | Status: DC
  Administered 2016-03-29 (×2): 2 via ORAL
  Filled 2016-03-28 (×2): qty 2

## 2016-03-28 MED ORDER — ONDANSETRON HCL 4 MG/2ML IJ SOLN: 4.0000 mg | INTRAMUSCULAR | Status: DC | PRN

## 2016-03-28 MED ORDER — ACETAMINOPHEN 325 MG PO TABS: 650.0000 mg | ORAL_TABLET | ORAL | Status: DC | PRN

## 2016-03-28 MED ORDER — IBUPROFEN 600 MG PO TABS
600.0000 mg | ORAL_TABLET | Freq: Four times a day (QID) | ORAL | Status: DC
  Administered 2016-03-28 – 2016-03-30 (×9): 600 mg via ORAL
  Filled 2016-03-28 (×9): qty 1

## 2016-03-28 MED ORDER — COCONUT OIL OIL: 1.0000 "application " | TOPICAL_OIL | Status: DC | PRN

## 2016-03-28 NOTE — Anesthesia Postprocedure Evaluation (Signed)
Anesthesia Post Note  Patient: Sue Miller  Procedure(s) Performed: * No procedures listed *  Patient location during evaluation: Mother Baby Anesthesia Type: Epidural Level of consciousness: awake, awake and alert, oriented and patient cooperative Pain management: pain level controlled Vital Signs Assessment: post-procedure vital signs reviewed and stable Respiratory status: spontaneous breathing, nonlabored ventilation and respiratory function stable Cardiovascular status: stable Postop Assessment: no headache, no backache, patient able to bend at knees and no signs of nausea or vomiting Anesthetic complications: no        Last Vitals:  Vitals:   03/28/16 0415 03/28/16 0516  BP: (!) 140/101 140/78  Pulse: 70 82  Resp: 20 20  Temp: 37.6 C 37.4 C    Last Pain:  Vitals:   03/28/16 0516  TempSrc: Oral  PainSc: 0-No pain   Pain Goal: Patients Stated Pain Goal: 2 (03/27/16 2007)               Talar Fraley L

## 2016-03-28 NOTE — Lactation Note (Signed)
This note was copied from a baby's chart. Lactation Consultation Note  Patient Name: Sue Miller [id number] Date: 03/28/2016 Reason for consult: Initial assessment Baby sleepy and not latched since 0300 this am. Awakening techniques demonstrated to Mom. Jaw massage and suck training demonstrated, baby tongue thrusting with trying to latch. Repeated attempts needed for baby to latch, baby would take few suckles then come off the breast. Colostrum dripping in baby's mouth with attempting latch. Demonstrated hand pump for Mom to use to pre-pump to help with latch. Mom has flat nipples but very compressible. Basic teaching reviewed with Mom. Encouraged to continue to BF with feeding ques. Pre-pump, do jaw massage and suck training to help with latch.  Ask for assist with latch. Lactation brochure left for review, advised of OP services and support group.   Maternal Data Has patient been taught Hand Expression?: Yes Does the patient have breastfeeding experience prior to this delivery?: No  Feeding Feeding Type: Breast Fed Length of feed: 5 min  LATCH Score/Interventions Latch: Repeated attempts needed to sustain latch, nipple held in mouth throughout feeding, stimulation needed to elicit sucking reflex. Intervention(s): Skin to skin;Teach feeding cues;Waking techniques Intervention(s): Adjust position;Assist with latch;Breast massage;Breast compression  Audible Swallowing: None Intervention(s): Skin to skin;Hand expression  Type of Nipple: Flat Intervention(s): Hand pump  Comfort (Breast/Nipple): Soft / non-tender     Hold (Positioning): Full assist, staff holds infant at breast Intervention(s): Breastfeeding basics reviewed;Support Pillows;Position options;Skin to skin  LATCH Score: 4  Lactation Tools Discussed/Used Tools: Pump Breast pump type: Manual WIC Program: Yes   Consult Status Consult Status: Follow-up Date: 03/29/16 Follow-up type: In-patient    Katrine Coho 03/28/2016, 3:21 PM

## 2016-03-29 NOTE — Progress Notes (Signed)
Post Partum Day#1 Subjective: no complaints, up ad lib, voiding and tolerating PO  Objective: Blood pressure 132/86, pulse 83, temperature 98.4 F (36.9 C), temperature source Oral, resp. rate 20, height 5\' 3"  (1.6 m), weight 102.1 kg (225 lb), last menstrual period 06/23/2015, SpO2 98 %, unknown if currently breastfeeding.  Physical Exam:  General: alert Lochia: appropriate Uterine Fundus: firm and NT at U-1 DVT Evaluation: No evidence of DVT seen on physical exam.   Recent Labs  03/26/16 1300 03/27/16 1158  HGB 9.8* 9.3*  HCT 29.5* 29.6*    Assessment/Plan: Plan for discharge tomorrow  Follow BPs today and decide if she needs an antihypertensive prior to discharge   LOS: 3 days   Emily Filbert 03/29/2016, 6:38 AM

## 2016-03-29 NOTE — Lactation Note (Signed)
This note was copied from a baby's chart. Lactation Consultation Note  Mother reports that she is trying to BF baby before most feedings but baby does not latch.  She states her plan is to continue working on breastfeeding.  She is not pumping here and does not have a pump at home but plans to obtain one from [facility].  Reviewed supply and demand and encouraged pumping every 3 hours while she is here and at home if baby is not latching.  Review engorgement relief in the event engorgement occurs.  Asked mother if there was anything we could do to help her reach her goals and she denied need for assist. Follow-up if requested by mother. Patient Name: Sue Miller [id number] Date: 03/29/2016 Reason for consult: Follow-up assessment   Maternal Data    Feeding Feeding Type: Bottle Fed - Formula Nipple Type: Slow - flow  LATCH Score/Interventions                      Lactation Tools Discussed/Used     Consult Status Consult Status: Complete    Van Clines 03/29/2016, 5:19 PM

## 2016-03-30 MED ORDER — IBUPROFEN 600 MG PO TABS: 600.0000 mg | ORAL_TABLET | Freq: Four times a day (QID) | ORAL | 0 refills | Status: AC

## 2016-03-30 NOTE — Discharge Instructions (Signed)
Home Care Instructions for Mom ° ACTIVITY °· Gradually return to your regular activities. °· Let yourself rest. Nap while your baby sleeps. °· Avoid lifting anything that is heavier than 10 lb (4.5 kg) until your health care provider says it is okay. °· Avoid activities that take a lot of effort and energy (are strenuous) until approved by your health care provider. Walking at a slow-to-moderate pace is usually safe. °· If you had a cesarean delivery: °¨ Do not vacuum, climb stairs, or drive a car for 4-6 weeks. °¨ Have someone help you at home until you feel like you can do your usual activities yourself. °¨ Do exercises as told by your health care provider, if this applies. °VAGINAL BLEEDING °You may continue to bleed for 4-6 weeks after delivery. Over time, the amount of blood usually decreases and the color of the blood usually gets lighter. However, the flow of bright red blood may increase if you have been too active. If you need to use more than one pad in an hour because your pad gets soaked, or if you pass a large clot: °· Lie down. °· Raise your feet. °· Place a cold compress on your lower abdomen. °· Rest. °· Call your health care provider. °If you are breastfeeding, your period should return anytime between 8 weeks after delivery and the time that you stop breastfeeding. If you are not breastfeeding, your period should return 6-8 weeks after delivery. °PERINEAL CARE °The perineal area, or perineum, is the part of your body between your thighs. After delivery, this area needs special care. Follow these instructions as told by your health care provider. °· Take warm tub baths for 15-20 minutes. °· Use medicated pads and pain-relieving sprays and creams as told. °· Do not use tampons or douches until vaginal bleeding has stopped. °· Each time you go to the bathroom: °¨ Use a peri bottle. °¨ Change your pad. °¨ Use towelettes in place of toilet paper until your stitches have healed. °· Do Kegel exercises  every day. Kegel exercises help to maintain the muscles that support the vagina, bladder, and bowels. You can do these exercises while you are standing, sitting, or lying down. To do Kegel exercises: °¨ Tighten the muscles of your abdomen and the muscles that surround your birth canal. °¨ Hold for a few seconds. °¨ Relax. °¨ Repeat until you have done this 5 times in a row. °· To prevent hemorrhoids from developing or getting worse: °¨ Drink enough fluid to keep your urine clear or pale yellow. °¨ Avoid straining when having a bowel movement. °¨ Take over-the-counter medicines and stool softeners as told by your health care provider. °BREAST CARE °· Wear a tight-fitting bra. °· Avoid taking over-the-counter pain medicine for breast discomfort. °· Apply ice to the breasts to help with discomfort as needed: °¨ Put ice in a plastic bag. °¨ Place a towel between your skin and the bag. °¨ Leave the ice on for 20 minutes or as told by your health care provider. °NUTRITION °· Eat a well-balanced diet. °· Do not try to lose weight quickly by cutting back on calories. °· Take your prenatal vitamins until your postpartum checkup or until your health care provider tells you to stop. °POSTPARTUM DEPRESSION °You may find yourself crying for no apparent reason and unable to cope with all of the changes that come with having a newborn. This mood is called postpartum depression. Postpartum depression happens because your hormone levels change after delivery.   If you have postpartum depression, get support from your partner, friends, and family. If the depression does not go away on its own after several weeks, contact your health care provider. °BREAST SELF-EXAM °Do a breast self-exam each month, at the same time of the month. If you are breastfeeding, check your breasts just after a feeding, when your breasts are less full. If you are breastfeeding and your period has started, check your breasts on day 5, 6, or 7 of your  period. °Report any lumps, bumps, or discharge to your health care provider. Know that breasts are normally lumpy if you are breastfeeding. This is temporary, and it is not a health risk. °INTIMACY AND SEXUALITY °Avoid sexual activity for at least 3-4 weeks after delivery or until the brownish-red vaginal flow is completely gone. If you want to avoid pregnancy, use some form of birth control. You can get pregnant after delivery, even if you have not had your period. °SEEK MEDICAL CARE IF: °· You feel unable to cope with the changes that a child brings to your life, and these feelings do not go away after several weeks. °· You notice a lump, a bump, or discharge on your breast. °SEEK IMMEDIATE MEDICAL CARE IF: °· Blood soaks your pad in 1 hour or less. °· You have: °¨ Severe pain or cramping in your lower abdomen. °¨ A bad-smelling vaginal discharge. °¨ A fever that is not controlled by medicine. °¨ A fever, and an area of your breast is red and sore. °¨ Pain or redness in your calf. °¨ Sudden, severe chest pain. °¨ Shortness of breath. °¨ Painful or bloody urination. °¨ Problems with your vision. °· You vomit for 12 hours or longer. °· You develop a severe headache. °· You have serious thoughts about hurting yourself, your child, or anyone else. °This information is not intended to replace advice given to you by your health care provider. Make sure you discuss any questions you have with your health care provider. °Document Released: 01/02/2000 Document Revised: 06/12/2015 Document Reviewed: 07/08/2014 °Elsevier Interactive Patient Education © 2017 [employer] °Contraception Choices °Contraception, also called birth control, means things to use or ways to try not to get pregnant. °Hormonal birth control ° This kind of birth control uses hormones. Here are some types of hormonal birth control: °· A tube that is put under skin of the arm (implant). The tube can stay in for as long as 3 years. °· Shots to get every 3  months (injections). °· Pills to take every day (birth control pills). °· A patch to change 1 time each week for 3 weeks (birth control patch). After that, the patch is taken off for 1 week. °· A ring to put in the vagina. The ring is left in for 3 weeks. Then it is taken out of the vagina for 1 week. Then a new ring is put in. °· Pills to take after unprotected sex (emergency birth control pills). °Barrier birth control ° Here are some types of barrier birth control: °· A thin covering that is put on the penis before sex (female condom). The covering is thrown away after sex. °· A soft, loose covering that is put in the vagina before sex (female condom). The covering is thrown away after sex. °· A rubber bowl that sits over the cervix (diaphragm). The bowl must be made for you. The bowl is put into the vagina before sex. The bowl is left in for 6-8 hours after sex. It   is taken out within 24 hours. °· A small, soft cup that fits over the cervix (cervical cap). The cup must be made for you. The cup can be left in for 6-8 hours after sex. It is taken out within 48 hours. °· A sponge that is put into the vagina before sex. It must be left in for at least 6 hours after sex. It must be taken out within 30 hours. Then it is thrown away. °· A chemical that kills or stops sperm from getting into the uterus (spermicide). It may be a pill, cream, jelly, or foam to put in the vagina. The chemical should be used at least 10-15 minutes before sex. °IUD (intrauterine) birth control °An IUD is a small, T-shaped piece of plastic. It is put inside the uterus. There are two kinds: °· Hormone IUD. This kind can stay in for 3-5 years. °· Copper IUD. This kind can stay in for 10 years. °Permanent birth control °Here are some types of permanent birth control: °· Surgery to block the fallopian tubes. °· Having an insert put into each fallopian tube. °· Surgery to tie off the tubes that carry sperm (vasectomy). °Natural planning birth  control °Here are some types of natural planning birth control: °· Not having sex on the days the woman could get pregnant. °· Using a calendar: °¨ To keep track of the length of each period. °¨ To find out what days pregnancy can happen. °¨ To plan to not have sex on days when pregnancy can happen. °· Watching for symptoms of ovulation and not having sex during ovulation. One way the woman can check for ovulation is to check her temperature. °· Waiting to have sex until after ovulation. °Summary °· Contraception, also called birth control, means things to use or ways to try not to get pregnant. °· Hormonal methods of birth control include implants, injections, pills, patches, vaginal rings, and emergency birth control pills. °· Barrier methods of birth control can include female condoms, female condoms, diaphragms, cervical caps, sponges, and spermicides. °· There are two types of IUD (intrauterine device) birth control. An IUD can be put in a woman's uterus to prevent pregnancy for 3-5 years. °· Permanent sterilization can be done through a procedure for males, females, or both. °· Natural planning methods involve not having sex on the days when the woman could get pregnant. °This information is not intended to replace advice given to you by your health care provider. Make sure you discuss any questions you have with your health care provider. °Document Released: 11/01/2008 Document Revised: 01/15/2016 Document Reviewed: 01/15/2016 °Elsevier Interactive Patient Education © 2017 Elsevier Inc. ° °

## 2016-03-30 NOTE — Progress Notes (Signed)
Discharge education complete, discharge instructions and follow up appointment discussed. Patient verbalized understanding. 

## 2016-03-30 NOTE — Discharge Summary (Signed)
Obstetric Discharge Summary Reason for Admission: term, IOL for pre eclampsia, cytotec used Prenatal Procedures: Preeclampsia and ultrasound Intrapartum Procedures: spontaneous vaginal delivery Postpartum Procedures: none Complications-Operative and Postpartum: 3rd degree perineal laceration Hemoglobin  Date Value Ref Range Status  03/27/2016 9.3 (L) 12.0 - 15.0 g/dL Final   HCT  Date Value Ref Range Status  03/27/2016 29.6 (L) 36.0 - 46.0 % Final   Hematocrit  Date Value Ref Range Status  03/11/2016 31.7 (L) 34.0 - 46.6 % Final    Physical Exam:  General: alert Lochia: appropriate Uterine Fundus: firm and NT at U-2 DVT Evaluation: No evidence of DVT seen on physical exam.  Discharge Diagnoses: Term Pregnancy-delivered  Discharge Information: Date: 03/30/2016 Activity: pelvic rest Diet: routine Medications: PNV and Ibuprofen Condition: stable Instructions: refer to practice specific booklet Discharge to: home  She is considering an IUD at her postpartum visit.   Follow-up [facility] [place]. Schedule an appointment as soon as possible for a visit in 6 week(s).   Specialty:  Obstetrics and Gynecology Contact information: [address] [place] [address] [phone]          Newborn Data: Live born female  Birth Weight: 6 lb 13 oz (3090 g) APGAR: 9, 10  Home with mother.  Sue Miller 03/30/2016, 8:28 AM

## 2016-03-30 NOTE — Progress Notes (Signed)
OB Discharge Summary     Patient Name: Sue Miller DOB: [birth year] MRN: [id number]  Date of admission: 03/26/2016 Delivering MD: Purvis Sheffield, ANA   Date of discharge: 03/30/2016  Admitting diagnosis: INDUCTION Intrauterine pregnancy: [redacted]w[redacted]d     Secondary diagnosis:  Active Problems:   Preeclampsia, third trimester  Additional problems: none     Discharge diagnosis: Term Pregnancy Delivered                                                                                                Post partum procedures:none  Augmentation: Cytotec  Complications: None  Hospital course:  Induction of Labor With Vaginal Delivery   21 y.o. yo G1P1001 at [redacted]w[redacted]d was admitted to the hospital 03/26/2016 for induction of labor.  Indication for induction: Preeclampsia.  Patient had an uncomplicated labor course as follows: Membrane Rupture Time/Date: 4:23 PM ,03/27/2016   Intrapartum Procedures: Episiotomy: None [1]                                         Lacerations:  3rd degree [4]  Patient had delivery of a Viable infant.  Information for the patient's newborn:  Anajulia, Leyendecker [[id number]]      03/28/2016  Details of delivery can be found in separate delivery note.  Patient had a routine postpartum course. Patient is discharged home 03/30/16.  Physical exam  Vitals:   03/29/16 0730 03/29/16 1803 03/30/16 0319 03/30/16 0648  BP: 113/67 (!) 136/94 137/80 132/82  Pulse: 65 93 81 77  Resp: 20 20  18   Temp: 98 F (36.7 C) 98.9 F (37.2 C)  98.2 F (36.8 C)  TempSrc:  Oral    SpO2:      Weight:      Height:       General: alert Lochia: appropriate Uterine Fundus: firm Incision: N/A DVT Evaluation: No evidence of DVT seen on physical exam. Labs: Lab Results  Component Value Date   WBC 10.6 (H) 03/27/2016   HGB 9.3 (L) 03/27/2016   HCT 29.6 (L) 03/27/2016   MCV 90.5 03/27/2016   PLT 258 03/27/2016   CMP Latest Ref Rng & Units 03/26/2016  Glucose 65 - 99 mg/dL 73  BUN 6 -  20 mg/dL 7  Creatinine 0.44 - 1.00 mg/dL 0.62  Sodium 135 - 145 mmol/L 135  Potassium 3.5 - 5.1 mmol/L 4.0  Chloride 101 - 111 mmol/L 104  CO2 22 - 32 mmol/L 20(L)  Calcium 8.9 - 10.3 mg/dL 9.1  Total Protein 6.5 - 8.1 g/dL 6.7  Total Bilirubin 0.3 - 1.2 mg/dL 0.3  Alkaline Phos 38 - 126 U/L 249(H)  AST 15 - 41 U/L 22  ALT 14 - 54 U/L 15    Discharge instruction: per After Visit Summary and "Baby and Me Booklet".  After visit meds:  Allergies as of 03/30/2016   No Known Allergies     Medication List    TAKE these medications   acetaminophen 325 MG tablet Commonly  known as:  TYLENOL Take 650 mg by mouth every 6 (six) hours as needed for headache.   ibuprofen 600 MG tablet Commonly known as:  ADVIL,MOTRIN Take 1 tablet (600 mg total) by mouth every 6 (six) hours.   PRENATAL VITAMINS PO Take 1 tablet by mouth daily.       Diet: routine diet  Activity: Advance as tolerated. Pelvic rest for 6 weeks.   Outpatient follow up:6 weeks Follow up Appt:No future appointments. Follow up Visit:No Follow-up on file.  Postpartum contraception: IUD considering IUD  Newborn Data: Live born female  Birth Weight: 6 lb 13 oz (3090 g) APGAR: 9, 10  Baby Feeding: Breast Disposition:home with mother   03/30/2016 Arturo Morton, Student-PA  OB FELLOW MEDICAL STUDENT NOTE ATTESTATION  I have seen and examined this patient. Note this is a Careers information officer note and as such does not necessarily reflect the patient's plan of care. Please see discharge note for this date of service.    Katherine Basset, DO OB Fellow

## 2016-03-30 NOTE — Progress Notes (Signed)
Mom gave baby a pacifier. Rn educated mom regarding feeding cues. Encouraged mom to feed baby again.

## 2016-04-02 ENCOUNTER — Encounter: Payer: Medicaid Other | Admitting: Family Medicine

## 2016-04-02 LAB — UIFE/LIGHT CHAINS/TP QN, 24-HR UR
% BETA, URINE: UNDETERMINED %
ALBUMIN, U: UNDETERMINED %
ALPHA 1 URINE: UNDETERMINED %
Alpha 2, Urine: UNDETERMINED %
FREE KAPPA/LAMBDA RATIO: 12.33 — AB (ref 2.04–10.37)
FREE LAMBDA LT CHAINS, UR: 21 mg/L — AB (ref 0.24–6.66)
Free Lt Chn Excr Rate: 259 mg/L — ABNORMAL HIGH (ref 1.35–24.19)
GAMMA GLOBULIN URINE: UNDETERMINED %
Immunofixation Result, Urine: UNDETERMINED
M-SPIKE %, Urine: UNDETERMINED %
TOTAL PROTEIN, URINE-UPE24: 179.4 mg/dL
Total Protein, Urine-Ur/day: 3364 mg/24 hr — ABNORMAL HIGH (ref 30–150)
Total Volume: 1875

## 2016-04-08 ENCOUNTER — Inpatient Hospital Stay (HOSPITAL_COMMUNITY)
Admission: AD | Admit: 2016-04-08 | Discharge: 2016-04-08 | DRG: 776 | Discharge status: Against Medical Advice | Payer: Medicaid Other | Source: Ambulatory Visit | Attending: Obstetrics & Gynecology | Admitting: Obstetrics & Gynecology

## 2016-04-08 DIAGNOSIS — O1495 Unspecified pre-eclampsia, complicating the puerperium: Secondary | ICD-10-CM | POA: Diagnosis not present

## 2016-04-08 DIAGNOSIS — Z79899 Other long term (current) drug therapy: Secondary | ICD-10-CM

## 2016-04-08 LAB — COMPREHENSIVE METABOLIC PANEL
ALBUMIN: 3.2 g/dL — AB (ref 3.5–5.0)
ALK PHOS: 143 U/L — AB (ref 38–126)
ALT: 17 U/L (ref 14–54)
AST: 20 U/L (ref 15–41)
Anion gap: 9 (ref 5–15)
BILIRUBIN TOTAL: 0.8 mg/dL (ref 0.3–1.2)
BUN: 14 mg/dL (ref 6–20)
CALCIUM: 8.6 mg/dL — AB (ref 8.9–10.3)
CHLORIDE: 108 mmol/L (ref 101–111)
CO2: 25 mmol/L (ref 22–32)
CREATININE: 0.67 mg/dL (ref 0.44–1.00)
GFR calc Af Amer: >60 mL/min (ref >60)
GFR calc non Af Amer: >60 mL/min (ref >60)
Glucose, Bld: 87 mg/dL (ref 65–99)
Potassium: 3.3 mmol/L — ABNORMAL LOW (ref 3.5–5.1)
Sodium: 142 mmol/L (ref 135–145)
Total Protein: 7.5 g/dL (ref 6.5–8.1)

## 2016-04-08 LAB — URINALYSIS, ROUTINE W REFLEX MICROSCOPIC
GLUCOSE, UA: 100 mg/dL — AB
KETONES UR: NEGATIVE mg/dL
Leukocytes, UA: NEGATIVE
Nitrite: NEGATIVE
PH: 6 (ref 5.0–8.0)
Protein, ur: >300 mg/dL — AB

## 2016-04-08 LAB — CBC
HEMATOCRIT: 32.4 % — AB (ref 36.0–46.0)
HEMOGLOBIN: 10.3 g/dL — AB (ref 12.0–15.0)
MCH: 28.2 pg (ref 26.0–34.0)
MCHC: 31.8 g/dL (ref 30.0–36.0)
MCV: 88.8 fL (ref 78.0–100.0)
Platelets: 448 10*3/uL — ABNORMAL HIGH (ref 150–400)
RBC: 3.65 MIL/uL — AB (ref 3.87–5.11)
RDW: 15.5 % (ref 11.5–15.5)
WBC: 5.6 10*3/uL (ref 4.0–10.5)

## 2016-04-08 LAB — PROTEIN / CREATININE RATIO, URINE
Creatinine, Urine: 398 mg/dL
Protein Creatinine Ratio: 2.09 mg/mg{Cre} — ABNORMAL HIGH (ref 0.00–0.15)
TOTAL PROTEIN, URINE: 832 mg/dL

## 2016-04-08 LAB — URINALYSIS, MICROSCOPIC (REFLEX)

## 2016-04-08 MED ORDER — CALCIUM CARBONATE ANTACID 500 MG PO CHEW: 2.0000 | CHEWABLE_TABLET | ORAL | Status: DC | PRN

## 2016-04-08 MED ORDER — ZOLPIDEM TARTRATE 5 MG PO TABS: 5.0000 mg | ORAL_TABLET | Freq: Every evening | ORAL | Status: DC | PRN

## 2016-04-08 MED ORDER — ACETAMINOPHEN 325 MG PO TABS: 650.0000 mg | ORAL_TABLET | ORAL | Status: DC | PRN

## 2016-04-08 MED ORDER — AMLODIPINE BESYLATE 10 MG PO TABS: 10.0000 mg | ORAL_TABLET | Freq: Every day | ORAL | 0 refills | Status: AC

## 2016-04-08 MED ORDER — DOCUSATE SODIUM 100 MG PO CAPS
100.0000 mg | ORAL_CAPSULE | Freq: Every day | ORAL | Status: DC
  Filled 2016-04-08: qty 1

## 2016-04-08 MED ORDER — MAGNESIUM SULFATE BOLUS VIA INFUSION
4.0000 g | Freq: Once | INTRAVENOUS | Status: DC
  Filled 2016-04-08: qty 500

## 2016-04-08 MED ORDER — MAGNESIUM SULFATE 40 G IN LACTATED RINGERS - SIMPLE
2.0000 g/h | INTRAVENOUS | Status: DC
  Filled 2016-04-08: qty 500

## 2016-04-08 MED ORDER — NIFEDIPINE 10 MG PO CAPS
20.0000 mg | ORAL_CAPSULE | Freq: Once | ORAL | Status: AC
  Administered 2016-04-08: 20 mg via ORAL
  Filled 2016-04-08: qty 2

## 2016-04-08 MED ORDER — PRENATAL MULTIVITAMIN CH
1.0000 | ORAL_TABLET | Freq: Every day | ORAL | Status: DC
  Filled 2016-04-08: qty 1

## 2016-04-08 MED ORDER — LACTATED RINGERS IV SOLN: INTRAVENOUS | Status: DC

## 2016-04-08 NOTE — MAU Note (Signed)
PT  SAYS SHE  DEL VAG  ON 3-11   -   VIABLE  BABY  BOY   -        AFTER  DEL - BP  HIGH.    [place].    BUT  HOME NURSE  CAME   BY  TODAY -  BP HIGH  .     DENIES  H/A ,  EPIGASTRIC  PAIN , NO BLURRED   VISION.  Marland Kitchen    BREAST FEEDING.

## 2016-04-08 NOTE — MAU Note (Signed)
Patient states she cannot be admitted for treatment.  Discussed risks of seizure, stroke, and/or death with Marcille Buffy, CNM.  Patient states she still cannot find anyone to stay with her during admission so she wishes to leave.  Patient signed out against medical advice at 2336.  Was informed again of the risks of leaving and patient verbalized understanding.  RN informed patient to return on Saturday 04/10/2016 for a follow up blood pressure check.

## 2016-04-08 NOTE — Discharge Instructions (Signed)

## 2016-04-08 NOTE — MAU Provider Note (Signed)
History     CSN: [id number]  Arrival date and time: 04/08/16 2003   First Provider Initiated Contact with Patient 04/08/16 2052      Chief Complaint  Patient presents with  . Hypertension   Sue Miller is a 21 y.o. G1P1001 who is S/P NSVD on 03/28/16 after induction for pre-eclampsia. No record of her being on magnesium while here. She was not sent home on any antihypertensives. Her blood pressures around the time of DC were 130s/80-90s. She states that when she was seen by the baby love nurse today she had elevated blood pressure, and they sent her here for further evaluation. She denies any HA, visual disturbances or RUQ pain. She is breastfeeding. She reports normal lochia.    Hypertension  This is a new problem. The current episode started more than 1 month ago. The problem is unchanged. Condition status: No meds  Pertinent negatives include no headaches. Associated agents: pregnancy    No past medical history on file.  Past Surgical History:  Procedure Laterality Date  . NO PAST SURGERIES      Family History  Problem Relation Age of Onset  . Adopted: Yes  . Cancer Neg Hx   . Hypertension Neg Hx     Social History  Substance Use Topics  . Smoking status: Never Smoker  . Smokeless tobacco: Never Used  . Alcohol use No    Allergies: No Known Allergies  Prescriptions Prior to Admission  Medication Sig Dispense Refill Last Dose  . acetaminophen (TYLENOL) 325 MG tablet Take 650 mg by mouth every 6 (six) hours as needed for headache.   04/07/2016 at Unknown time  . Prenatal Multivit-Min-Fe-FA (PRENATAL VITAMINS PO) Take 1 tablet by mouth daily.    04/07/2016 at Unknown time  . ibuprofen (ADVIL,MOTRIN) 600 MG tablet Take 1 tablet (600 mg total) by mouth every 6 (six) hours. (Patient not taking: Reported on 04/08/2016) 30 tablet 0 Not Taking at Unknown time    Review of Systems  Constitutional: Negative for chills and fever.  Eyes: Negative for visual disturbance.   Gastrointestinal: Negative for abdominal pain.  Neurological: Negative for headaches.   Physical Exam   Blood pressure (!) 131/95, pulse 84, temperature 98.2 F (36.8 C), temperature source Oral, resp. rate 18, last menstrual period 06/23/2015, SpO2 100 %, unknown if currently breastfeeding.  Physical Exam  Nursing note and vitals reviewed. Constitutional: She is oriented to person, place, and time. She appears well-developed and well-nourished. No distress.  HENT:  Head: Normocephalic.  Cardiovascular: Normal rate.   Respiratory: Effort normal.  GI: Soft. There is no tenderness. There is no rebound.  Neurological: She is alert and oriented to person, place, and time.  +clonus   Skin: Skin is warm.  Psychiatric: She has a normal mood and affect.   Results for orders placed or performed during the hospital encounter of 04/08/16 (from the past 24 hour(s))  CBC     Status: Abnormal   Collection Time: 04/08/16  8:43 PM  Result Value Ref Range   WBC 5.6 4.0 - 10.5 K/uL   RBC 3.65 (L) 3.87 - 5.11 MIL/uL   Hemoglobin 10.3 (L) 12.0 - 15.0 g/dL   HCT 32.4 (L) 36.0 - 46.0 %   MCV 88.8 78.0 - 100.0 fL   MCH 28.2 26.0 - 34.0 pg   MCHC 31.8 30.0 - 36.0 g/dL   RDW 15.5 11.5 - 15.5 %   Platelets 448 (H) 150 - 400 K/uL  Comprehensive metabolic  panel     Status: Abnormal   Collection Time: 04/08/16  8:43 PM  Result Value Ref Range   Sodium 142 135 - 145 mmol/L   Potassium 3.3 (L) 3.5 - 5.1 mmol/L   Chloride 108 101 - 111 mmol/L   CO2 25 22 - 32 mmol/L   Glucose, Bld 87 65 - 99 mg/dL   BUN 14 6 - 20 mg/dL   Creatinine, Ser 0.67 0.44 - 1.00 mg/dL   Calcium 8.6 (L) 8.9 - 10.3 mg/dL   Total Protein 7.5 6.5 - 8.1 g/dL   Albumin 3.2 (L) 3.5 - 5.0 g/dL   AST 20 15 - 41 U/L   ALT 17 14 - 54 U/L   Alkaline Phosphatase 143 (H) 38 - 126 U/L   Total Bilirubin 0.8 0.3 - 1.2 mg/dL   GFR calc non Af Amer >60 >60 mL/min   GFR calc Af Amer >60 >60 mL/min   Anion gap 9 5 - 15  Urinalysis,  Routine w reflex microscopic     Status: Abnormal   Collection Time: 04/08/16  8:45 PM  Result Value Ref Range   Color, Urine YELLOW YELLOW   APPearance CLEAR CLEAR   Specific Gravity, Urine >1.030 (H) 1.005 - 1.030   pH 6.0 5.0 - 8.0   Glucose, UA 100 (A) NEGATIVE mg/dL   Hgb urine dipstick MODERATE (A) NEGATIVE   Bilirubin Urine SMALL (A) NEGATIVE   Ketones, ur NEGATIVE NEGATIVE mg/dL   Protein, ur >300 (A) NEGATIVE mg/dL   Nitrite NEGATIVE NEGATIVE   Leukocytes, UA NEGATIVE NEGATIVE  Protein / creatinine ratio, urine     Status: Abnormal   Collection Time: 04/08/16  8:45 PM  Result Value Ref Range   Creatinine, Urine 398.00 mg/dL   Total Protein, Urine 832 mg/dL   Protein Creatinine Ratio 2.09 (H) 0.00 - 0.15 mg/mg[Cre]  Urinalysis, Microscopic (reflex)     Status: Abnormal   Collection Time: 04/08/16  8:45 PM  Result Value Ref Range   RBC / HPF 0-5 0 - 5 RBC/hpf   WBC, UA 6-30 0 - 5 WBC/hpf   Bacteria, UA FEW (A) NONE SEEN   Squamous Epithelial / LPF 0-5 (A) NONE SEEN    MAU Course  Procedures  MDM 2235: D/W Dr. Harolyn Rutherford, presence of neurological sign (clonus) we will admit for magnesium prophylaxis  2314: Patient states that she is unable to find anyone to help her with the baby if she stays. D/W the patient at length about the risk of seizure, stroke and death. She refuses to stay at this time and will sign out [place]: D/W Dr. Harolyn Rutherford, patient needs to sign out AMA. Give 20mg  procardia now and can send home with rx for amlodipine 10mg . Return Saturday for B/P check.   Assessment and Plan   1. Preeclampsia in postpartum period    Admit to 3rd floor Magnesium Consider anti-hypertensives as needed   Mathis Bud 04/08/2016, 8:56 PM   Patient will sign out [place] RX for amlodipine 10mg  q day given

## 2016-04-10 LAB — URINE CULTURE: CULTURE: NO GROWTH

## 2016-06-10 ENCOUNTER — Ambulatory Visit (INDEPENDENT_AMBULATORY_CARE_PROVIDER_SITE_OTHER): Payer: Medicaid Other | Admitting: Family Medicine

## 2016-06-10 VITALS — BP 126/80 | HR 102 | Ht 63.0 in | Wt 185.0 lb

## 2016-06-10 DIAGNOSIS — Z01812 Encounter for preprocedural laboratory examination: Secondary | ICD-10-CM

## 2016-06-10 DIAGNOSIS — Z3042 Encounter for surveillance of injectable contraceptive: Secondary | ICD-10-CM

## 2016-06-10 DIAGNOSIS — Z3202 Encounter for pregnancy test, result negative: Secondary | ICD-10-CM | POA: Diagnosis not present

## 2016-06-10 DIAGNOSIS — Z30013 Encounter for initial prescription of injectable contraceptive: Secondary | ICD-10-CM

## 2016-06-10 DIAGNOSIS — I10 Essential (primary) hypertension: Secondary | ICD-10-CM | POA: Insufficient documentation

## 2016-06-10 LAB — POCT URINE PREGNANCY: PREG TEST UR: NEGATIVE

## 2016-06-10 MED ORDER — MEDROXYPROGESTERONE ACETATE 150 MG/ML IM SUSP
150.0000 mg | Freq: Once | INTRAMUSCULAR | Status: AC
  Administered 2016-06-10: 150 mg via INTRAMUSCULAR

## 2016-06-10 NOTE — Progress Notes (Signed)
   Subjective:    Patient ID: Sue Miller, female    DOB: 07-06-1995, 21 y.o.   MRN: [id number]  HPI Patient 3 months postpartum. Had severe preeclampsia, started on Norvasc. Not having any problems currently. Denies HA, blurred vision, edema. Would like to start contraception. Was on OCPs in the past. No PPD, incontinence.   Review of Systems     Objective:   Physical Exam  Constitutional: She appears well-developed and well-nourished.  HENT:  Head: Normocephalic and atraumatic.  Cardiovascular: Normal rate, regular rhythm and normal heart sounds.   Pulmonary/Chest: Effort normal and breath sounds normal.  Abdominal: Soft. She exhibits no distension.  Skin: Skin is warm and dry.  Psychiatric: She has a normal mood and affect. Her behavior is normal. Judgment and thought content normal.      Assessment & Plan:  1. Encounter for initial prescription of injectable contraceptive Discussed contraception options. Would like depo.  2. Essential hypertension Refer to family medicine. Continue norvasc.

## 2016-09-10 ENCOUNTER — Ambulatory Visit: Payer: Medicaid Other

## 2016-10-23 ENCOUNTER — Encounter (HOSPITAL_COMMUNITY): Payer: Self-pay | Admitting: Emergency Medicine

## 2016-10-23 ENCOUNTER — Emergency Department (HOSPITAL_COMMUNITY): Payer: Medicaid Other

## 2016-10-23 ENCOUNTER — Emergency Department (HOSPITAL_COMMUNITY)
Admission: EM | Admit: 2016-10-23 | Discharge: 2016-10-23 | Disposition: A | Discharge status: Home / Self Care | Payer: Medicaid Other | Attending: Emergency Medicine | Admitting: Emergency Medicine

## 2016-10-23 DIAGNOSIS — Z79899 Other long term (current) drug therapy: Secondary | ICD-10-CM | POA: Insufficient documentation

## 2016-10-23 DIAGNOSIS — D259 Leiomyoma of uterus, unspecified: Secondary | ICD-10-CM

## 2016-10-23 DIAGNOSIS — I10 Essential (primary) hypertension: Secondary | ICD-10-CM | POA: Insufficient documentation

## 2016-10-23 DIAGNOSIS — R102 Pelvic and perineal pain: Secondary | ICD-10-CM

## 2016-10-23 LAB — CBC
HCT: 32.4 % — ABNORMAL LOW (ref 36.0–46.0)
HEMOGLOBIN: 10.3 g/dL — AB (ref 12.0–15.0)
MCH: 28.1 pg (ref 26.0–34.0)
MCHC: 31.8 g/dL (ref 30.0–36.0)
MCV: 88.5 fL (ref 78.0–100.0)
PLATELETS: 469 10*3/uL — AB (ref 150–400)
RBC: 3.66 MIL/uL — AB (ref 3.87–5.11)
RDW: 13.9 % (ref 11.5–15.5)
WBC: 13.3 10*3/uL — AB (ref 4.0–10.5)

## 2016-10-23 LAB — COMPREHENSIVE METABOLIC PANEL
ALK PHOS: 146 U/L — AB (ref 38–126)
ALT: 25 U/L (ref 14–54)
AST: 23 U/L (ref 15–41)
Albumin: 4.4 g/dL (ref 3.5–5.0)
Anion gap: 9 (ref 5–15)
BUN: 6 mg/dL (ref 6–20)
CHLORIDE: 106 mmol/L (ref 101–111)
CO2: 23 mmol/L (ref 22–32)
Calcium: 8.8 mg/dL — ABNORMAL LOW (ref 8.9–10.3)
Creatinine, Ser: 0.74 mg/dL (ref 0.44–1.00)
GFR calc Af Amer: >60 mL/min (ref >60)
GFR calc non Af Amer: >60 mL/min (ref >60)
GLUCOSE: 103 mg/dL — AB (ref 65–99)
Potassium: 3.3 mmol/L — ABNORMAL LOW (ref 3.5–5.1)
SODIUM: 138 mmol/L (ref 135–145)
Total Bilirubin: 0.6 mg/dL (ref 0.3–1.2)
Total Protein: 7.9 g/dL (ref 6.5–8.1)

## 2016-10-23 LAB — WET PREP, GENITAL
Sperm: NONE SEEN
Trich, Wet Prep: NONE SEEN
YEAST WET PREP: NONE SEEN

## 2016-10-23 LAB — URINALYSIS, ROUTINE W REFLEX MICROSCOPIC
BILIRUBIN URINE: NEGATIVE
Glucose, UA: NEGATIVE mg/dL
HGB URINE DIPSTICK: NEGATIVE
KETONES UR: NEGATIVE mg/dL
NITRITE: NEGATIVE
PH: 5 (ref 5.0–8.0)
Protein, ur: 30 mg/dL — AB
Specific Gravity, Urine: 1.029 (ref 1.005–1.030)

## 2016-10-23 LAB — HCG, QUANTITATIVE, PREGNANCY: hCG, Beta Chain, Quant, S: 1 m[IU]/mL (ref <5)

## 2016-10-23 LAB — LIPASE, BLOOD: LIPASE: 27 U/L (ref 11–51)

## 2016-10-23 MED ORDER — SODIUM CHLORIDE 0.9 % IV BOLUS (SEPSIS)
1000.0000 mL | Freq: Once | INTRAVENOUS | Status: AC
Start: 2016-10-23 — End: 2016-10-23
  Administered 2016-10-23: 1000 mL via INTRAVENOUS

## 2016-10-23 MED ORDER — KETOROLAC TROMETHAMINE 15 MG/ML IJ SOLN
15.0000 mg | Freq: Once | INTRAMUSCULAR | Status: AC
  Administered 2016-10-23: 15 mg via INTRAVENOUS
  Filled 2016-10-23: qty 1

## 2016-10-23 MED ORDER — SODIUM CHLORIDE 0.9 % IV BOLUS (SEPSIS)
1000.0000 mL | Freq: Once | INTRAVENOUS | Status: AC
  Administered 2016-10-23: 1000 mL via INTRAVENOUS

## 2016-10-23 MED ORDER — NAPROXEN 375 MG PO TABS: 375.0000 mg | ORAL_TABLET | Freq: Two times a day (BID) | ORAL | 0 refills | Status: AC | PRN

## 2016-10-23 MED ORDER — ONDANSETRON 4 MG PO TBDP: 4.0000 mg | ORAL_TABLET | Freq: Three times a day (TID) | ORAL | 0 refills | Status: AC | PRN

## 2016-10-23 MED ORDER — ACETAMINOPHEN 325 MG PO TABS
650.0000 mg | ORAL_TABLET | Freq: Once | ORAL | Status: AC
  Administered 2016-10-23: 650 mg via ORAL
  Filled 2016-10-23: qty 2

## 2016-10-23 MED ORDER — DEXTROSE 5 % IV SOLN
1.0000 g | Freq: Once | INTRAVENOUS | Status: AC
  Administered 2016-10-23: 1 g via INTRAVENOUS
  Filled 2016-10-23: qty 10

## 2016-10-23 MED ORDER — IOPAMIDOL (ISOVUE-300) INJECTION 61%
INTRAVENOUS | Status: AC
  Administered 2016-10-23: 100 mL
  Filled 2016-10-23: qty 100

## 2016-10-23 MED ORDER — MORPHINE SULFATE (PF) 4 MG/ML IV SOLN: 4.0000 mg | Freq: Once | INTRAVENOUS | Status: DC

## 2016-10-23 MED ORDER — METRONIDAZOLE 500 MG PO TABS: 500.0000 mg | ORAL_TABLET | Freq: Two times a day (BID) | ORAL | 0 refills | Status: AC

## 2016-10-23 MED ORDER — POTASSIUM CHLORIDE CRYS ER 20 MEQ PO TBCR
40.0000 meq | EXTENDED_RELEASE_TABLET | Freq: Once | ORAL | Status: AC
  Administered 2016-10-23: 40 meq via ORAL
  Filled 2016-10-23: qty 2

## 2016-10-23 MED ORDER — METRONIDAZOLE 500 MG PO TABS: 500.0000 mg | ORAL_TABLET | Freq: Two times a day (BID) | ORAL | 0 refills | Status: DC

## 2016-10-23 MED ORDER — DOXYCYCLINE HYCLATE 100 MG PO CAPS: 100.0000 mg | ORAL_CAPSULE | Freq: Two times a day (BID) | ORAL | 0 refills | Status: AC

## 2016-10-23 NOTE — ED Notes (Signed)
Provider at the bedside.  

## 2016-10-23 NOTE — ED Notes (Signed)
Pt reports lower abd pain since this AM, pt also reports she has been on her menstrual cycle X1 mo. Denies N/V/D. HR 130's.

## 2016-10-23 NOTE — ED Provider Notes (Signed)
[facility] DEPT Provider Note   CSN: [id number] Arrival date & time: 10/23/16  0408     History   Chief Complaint Chief Complaint  Patient presents with  . Abdominal Pain    HPI Sue Miller is a 21 y.o. female.  Patient is a 21 year old female with history of hypertension presenting for evaluation of abdominal pain. She reports a 2 day history of pain in the suprapubic region. She denies any bowel or bladder complaints. She denies any vaginal discharge, but does report she has been on her period for the past week.   The history is provided by the patient.  Abdominal Pain   This is a new problem. The current episode started 2 days ago. The problem occurs constantly. The problem has been gradually worsening. The pain is located in the suprapubic region. The pain is moderate. Pertinent negatives include fever, flatus, constipation, dysuria and hematuria. Exacerbated by: Movement and palpation. Nothing relieves the symptoms.    History reviewed. No pertinent past medical history.  Patient Active Problem List   Diagnosis Date Noted  . Essential hypertension 06/10/2016    Past Surgical History:  Procedure Laterality Date  . NO PAST SURGERIES      OB History    Gravida Para Term Preterm AB Living   1 1 1     1    SAB TAB Ectopic Multiple Live Births         0 1       Home Medications    Prior to Admission medications   Medication Sig Start Date End Date Taking? Authorizing Provider  acetaminophen (TYLENOL) 325 MG tablet Take 650 mg by mouth every 6 (six) hours as needed for headache.    [provider]  amLODipine (NORVASC) 10 MG tablet Take 1 tablet (10 mg total) by mouth daily. 04/08/16   Tresea Mall, CNM  ibuprofen (ADVIL,MOTRIN) 600 MG tablet Take 1 tablet (600 mg total) by mouth every 6 (six) hours. Patient not taking: Reported on 04/08/2016 03/30/16   Emily Filbert, MD  Prenatal Multivit-Min-Fe-FA (PRENATAL VITAMINS PO) Take 1 tablet by mouth  daily.     [provider]    Family History Family History  Problem Relation Age of Onset  . Adopted: Yes  . Cancer Neg Hx   . Hypertension Neg Hx     Social History Social History  Substance Use Topics  . Smoking status: Never Smoker  . Smokeless tobacco: Never Used  . Alcohol use No     Allergies   Patient has no known allergies.   Review of Systems Review of Systems  Constitutional: Negative for fever.  Gastrointestinal: Positive for abdominal pain. Negative for constipation and flatus.  Genitourinary: Negative for dysuria and hematuria.  All other systems reviewed and are negative.    Physical Exam Updated Vital Signs BP 129/88 (BP Location: Left Arm)   Pulse (!) 134   Temp 99.2 F (37.3 C) (Oral)   Resp 18   SpO2 100%   Physical Exam  Constitutional: She is oriented to person, place, and time. She appears well-developed and well-nourished. No distress.  HENT:  Head: Normocephalic and atraumatic.  Neck: Normal range of motion. Neck supple.  Cardiovascular: Normal rate and regular rhythm.  Exam reveals no gallop and no friction rub.   No murmur heard. Pulmonary/Chest: Effort normal and breath sounds normal. No respiratory distress. She has no wheezes.  Abdominal: Soft. Bowel sounds are normal. She exhibits no distension. There is tenderness.  There is no rebound and no guarding.  There is ttp in the suprapubic region.   Musculoskeletal: Normal range of motion.  Neurological: She is alert and oriented to person, place, and time.  Skin: Skin is warm and dry. She is not diaphoretic.  Nursing note and vitals reviewed.    ED Treatments / Results  Labs (all labs ordered are listed, but only abnormal results are displayed) Labs Reviewed  LIPASE, BLOOD  COMPREHENSIVE METABOLIC PANEL  CBC  URINALYSIS, ROUTINE W REFLEX MICROSCOPIC  HCG, QUANTITATIVE, PREGNANCY    EKG  EKG Interpretation None       Radiology No results  found.  Procedures Procedures (including critical care time)  Medications Ordered in ED Medications - No data to display   Initial Impression / Assessment and Plan / ED Course  I have reviewed the triage vital signs and the nursing notes.  Pertinent labs & imaging results that were available during my care of the patient were reviewed by me and considered in my medical decision making (see chart for details).  Patient presents with lower abdominal pain and elevated WBC. She is undergoing a CT scan to rule out appendicitis. Care will be signed out to Dr. Ellender Hose at shift change. He will obtain the results of the CT scan and determine the final disposition.  Final Clinical Impressions(s) / ED Diagnoses   Final diagnoses:  None    New Prescriptions New Prescriptions   No medications on file     Veryl Speak, MD 10/24/16 [phone]

## 2016-10-23 NOTE — ED Provider Notes (Addendum)
Assumed care from Dr. Stark Jock at 7 Am. Briefly, the patient is a 21 y.o. female with PMHx of  has no past medical history on file. here with RLQ pain. Lab work shows leukocytosis. Awaiting CT scan.  Labs Reviewed  COMPREHENSIVE METABOLIC PANEL - Abnormal; Notable for the following:       Result Value   Potassium 3.3 (*)    Glucose, Bld 103 (*)    Calcium 8.8 (*)    Alkaline Phosphatase 146 (*)    All other components within normal limits  CBC - Abnormal; Notable for the following:    WBC 13.3 (*)    RBC 3.66 (*)    Hemoglobin 10.3 (*)    HCT 32.4 (*)    Platelets 469 (*)    All other components within normal limits  URINALYSIS, ROUTINE W REFLEX MICROSCOPIC - Abnormal; Notable for the following:    Protein, ur 30 (*)    Leukocytes, UA SMALL (*)    Bacteria, UA FEW (*)    Squamous Epithelial / LPF 0-5 (*)    All other components within normal limits  LIPASE, BLOOD  HCG, QUANTITATIVE, PREGNANCY    Course of Care: -Labs reviewed. Pt with leukocytosis, mild hypokalemia. She is borderline febrile and tachycardic here. Will start IVF, toradol, replete K. UA does show 6-30 WBCs so sx may be 2/2 UTI with early pyelo, but awaiting CT scan. No vaginal bleeding or discharge, no concern for PID per sign out. -CT scan shows large fibroid and bilateral hydrosalpinx. Patient also has reactive enteritis and lymphadenopathy in the GI tract. On reassessment, she complains of persistent suprapubic pain. She is on her period. I suspect that her pain is secondary to her fibroid and menstrual period, although infection cannot be ruled out. Based on discussion with radiology, will obtain a transvaginal ultrasound. Will otherwise cover for possible PID, though she has no overt evidence of cervical motion tenderness on pelvic exam. Bilateral adnexa are tender but without mass on my pelvic exam. Pelvic exam was chaperoned. -TVUS shows possible hemorrhagic cyst, large fibroid but no torsion, no other masses. No  pyosalpinx. Pt improved with fluids, pain control here and is no longer tachycardic. Will d/c with outpt tx for PID, though CT als c/w viral GI illness. Pt updated on plan of care and is in agreement. Referred pt to OBGYN.    Duffy Bruce, MD 10/23/16 2114    Duffy Bruce, MD 10/23/16 2115

## 2016-10-24 LAB — URINE CULTURE
Culture: NO GROWTH
SPECIAL REQUESTS: NORMAL

## 2016-10-25 LAB — GC/CHLAMYDIA PROBE AMP (~~LOC~~) NOT AT ARMC
CHLAMYDIA, DNA PROBE: POSITIVE — AB
NEISSERIA GONORRHEA: POSITIVE — AB

## 2017-10-08 ENCOUNTER — Emergency Department (HOSPITAL_COMMUNITY): Payer: Medicaid Other

## 2017-10-08 ENCOUNTER — Emergency Department (HOSPITAL_COMMUNITY)
Admission: EM | Admit: 2017-10-08 | Discharge: 2017-10-08 | Disposition: A | Discharge status: Home / Self Care | Payer: Medicaid Other | Attending: Emergency Medicine | Admitting: Emergency Medicine

## 2017-10-08 ENCOUNTER — Encounter (HOSPITAL_COMMUNITY): Payer: Self-pay | Admitting: Emergency Medicine

## 2017-10-08 ENCOUNTER — Other Ambulatory Visit: Payer: Self-pay

## 2017-10-08 DIAGNOSIS — N12 Tubulo-interstitial nephritis, not specified as acute or chronic: Secondary | ICD-10-CM | POA: Diagnosis not present

## 2017-10-08 DIAGNOSIS — Z79899 Other long term (current) drug therapy: Secondary | ICD-10-CM | POA: Insufficient documentation

## 2017-10-08 DIAGNOSIS — K828 Other specified diseases of gallbladder: Secondary | ICD-10-CM | POA: Insufficient documentation

## 2017-10-08 DIAGNOSIS — I10 Essential (primary) hypertension: Secondary | ICD-10-CM | POA: Diagnosis not present

## 2017-10-08 DIAGNOSIS — R109 Unspecified abdominal pain: Secondary | ICD-10-CM | POA: Diagnosis present

## 2017-10-08 HISTORY — DX: Leiomyoma of uterus, unspecified: D25.9

## 2017-10-08 LAB — CBC
HEMATOCRIT: 32.1 % — AB (ref 36.0–46.0)
HEMOGLOBIN: 9.6 g/dL — AB (ref 12.0–15.0)
MCH: 27.5 pg (ref 26.0–34.0)
MCHC: 29.9 g/dL — AB (ref 30.0–36.0)
MCV: 92 fL (ref 78.0–100.0)
Platelets: 529 10*3/uL — ABNORMAL HIGH (ref 150–400)
RBC: 3.49 MIL/uL — AB (ref 3.87–5.11)
RDW: 14.1 % (ref 11.5–15.5)
WBC: 7.7 10*3/uL (ref 4.0–10.5)

## 2017-10-08 LAB — URINALYSIS, ROUTINE W REFLEX MICROSCOPIC
Bilirubin Urine: NEGATIVE
Glucose, UA: NEGATIVE mg/dL
HGB URINE DIPSTICK: NEGATIVE
Ketones, ur: NEGATIVE mg/dL
NITRITE: NEGATIVE
PROTEIN: 30 mg/dL — AB
Specific Gravity, Urine: 1.031 — ABNORMAL HIGH (ref 1.005–1.030)
WBC, UA: >50 WBC/hpf — ABNORMAL HIGH (ref 0–5)
pH: 5 (ref 5.0–8.0)

## 2017-10-08 LAB — COMPREHENSIVE METABOLIC PANEL
ALK PHOS: 90 U/L (ref 38–126)
ALT: 11 U/L (ref 0–44)
ANION GAP: 12 (ref 5–15)
AST: 16 U/L (ref 15–41)
Albumin: 3.7 g/dL (ref 3.5–5.0)
BUN: 7 mg/dL (ref 6–20)
CALCIUM: 9.3 mg/dL (ref 8.9–10.3)
CHLORIDE: 102 mmol/L (ref 98–111)
CO2: 24 mmol/L (ref 22–32)
Creatinine, Ser: 0.73 mg/dL (ref 0.44–1.00)
GFR calc non Af Amer: >60 mL/min (ref >60)
GLUCOSE: 90 mg/dL (ref 70–99)
POTASSIUM: 3.8 mmol/L (ref 3.5–5.1)
SODIUM: 138 mmol/L (ref 135–145)
Total Bilirubin: 0.5 mg/dL (ref 0.3–1.2)
Total Protein: 7.8 g/dL (ref 6.5–8.1)

## 2017-10-08 LAB — I-STAT BETA HCG BLOOD, ED (MC, WL, AP ONLY)

## 2017-10-08 LAB — LIPASE, BLOOD: LIPASE: 38 U/L (ref 11–51)

## 2017-10-08 MED ORDER — ONDANSETRON HCL 4 MG/2ML IJ SOLN
4.0000 mg | Freq: Once | INTRAMUSCULAR | Status: AC
  Administered 2017-10-08: 4 mg via INTRAVENOUS
  Filled 2017-10-08: qty 2

## 2017-10-08 MED ORDER — SODIUM CHLORIDE 0.9 % IV BOLUS (SEPSIS)
1000.0000 mL | Freq: Once | INTRAVENOUS | Status: AC
  Administered 2017-10-08: 1000 mL via INTRAVENOUS

## 2017-10-08 MED ORDER — SODIUM CHLORIDE 0.9 % IV SOLN: 1000.0000 mL | INTRAVENOUS | Status: DC

## 2017-10-08 MED ORDER — SODIUM CHLORIDE 0.9 % IV SOLN
1.0000 g | Freq: Once | INTRAVENOUS | Status: AC
  Administered 2017-10-08: 1 g via INTRAVENOUS
  Filled 2017-10-08: qty 10

## 2017-10-08 MED ORDER — CEPHALEXIN 500 MG PO CAPS: 1000.0000 mg | ORAL_CAPSULE | Freq: Two times a day (BID) | ORAL | 0 refills | Status: AC

## 2017-10-08 MED ORDER — IBUPROFEN 800 MG PO TABS: 800.0000 mg | ORAL_TABLET | Freq: Three times a day (TID) | ORAL | 0 refills | Status: AC

## 2017-10-08 MED ORDER — TRAMADOL HCL 50 MG PO TABS: 100.0000 mg | ORAL_TABLET | Freq: Four times a day (QID) | ORAL | 0 refills | Status: AC | PRN

## 2017-10-08 MED ORDER — KETOROLAC TROMETHAMINE 30 MG/ML IJ SOLN
30.0000 mg | Freq: Once | INTRAMUSCULAR | Status: AC
  Administered 2017-10-08: 30 mg via INTRAVENOUS
  Filled 2017-10-08: qty 1

## 2017-10-08 NOTE — ED Triage Notes (Signed)
Pt presents with lower abd cramping and R flank pain x 2 days ago; denies vaginal symptoms, denies urinary symptoms, hx of fibroids

## 2017-10-08 NOTE — ED Provider Notes (Signed)
[facility] EMERGENCY DEPARTMENT Provider Note   CSN: [id number] Arrival date & time: 10/08/17  1451     History   Chief Complaint Chief Complaint  Patient presents with  . Flank Pain  . Abdominal Cramping    HPI Breiana Miller is a 22 y.o. female.  HPI Started developing some cramping and discomfort on the right flank about 2 days ago.  Pain came on spontaneously without any known injury.  Made much worse by lying flat on the back or certain twisting motions or deep breath.  She denies history of kidney stone.  She reports she has had some lower abdominal cramping but no abnormal vaginal discharge or pain.  Patient reports he does have a history of fibroids.  She is not aware that she had fever.  She reports she had low-grade fever here.  Nausea.  No vomiting.  No other medical problems.  Patient reports she is adopted.  Family history unknown. Past Medical History:  Diagnosis Date  . Uterine fibroid     Patient Active Problem List   Diagnosis Date Noted  . Essential hypertension 06/10/2016    Past Surgical History:  Procedure Laterality Date  . NO PAST SURGERIES       OB History    Gravida  1   Para  1   Term  1   Preterm      AB      Living  1     SAB      TAB      Ectopic      Multiple  0   Live Births  1            Home Medications    Prior to Admission medications   Medication Sig Start Date End Date Taking? Authorizing Provider  acetaminophen (TYLENOL) 325 MG tablet Take 650 mg by mouth every 6 (six) hours as needed for headache.    [provider]  amLODipine (NORVASC) 10 MG tablet Take 1 tablet (10 mg total) by mouth daily. Patient not taking: Reported on 10/23/2016 04/08/16   Tresea Mall, CNM  cephALEXin (KEFLEX) 500 MG capsule Take 2 capsules (1,000 mg total) by mouth 2 (two) times daily. 10/08/17   Charlesetta Shanks, MD  ibuprofen (ADVIL,MOTRIN) 600 MG tablet Take 1 tablet (600 mg total) by mouth every  6 (six) hours. Patient not taking: Reported on 04/08/2016 03/30/16   Emily Filbert, MD  ibuprofen (ADVIL,MOTRIN) 800 MG tablet Take 1 tablet (800 mg total) by mouth 3 (three) times daily. 10/08/17   Charlesetta Shanks, MD  metroNIDAZOLE (FLAGYL) 500 MG tablet Take 1 tablet (500 mg total) by mouth 2 (two) times daily. 10/23/16   Duffy Bruce, MD  ondansetron (ZOFRAN ODT) 4 MG disintegrating tablet Take 1 tablet (4 mg total) by mouth every 8 (eight) hours as needed for nausea or vomiting. 10/23/16   Duffy Bruce, MD  traMADol (ULTRAM) 50 MG tablet Take 2 tablets (100 mg total) by mouth every 6 (six) hours as needed. 10/08/17   Charlesetta Shanks, MD    Family History Family History  Adopted: Yes  Problem Relation Age of Onset  . Cancer Neg Hx   . Hypertension Neg Hx     Social History Social History   Tobacco Use  . Smoking status: Never Smoker  . Smokeless tobacco: Never Used  Substance Use Topics  . Alcohol use: No  . Drug use: No     Allergies   Patient  has no known allergies.   Review of Systems Review of Systems 10 Systems reviewed and are negative for acute change except as noted in the HPI.   Physical Exam Updated Vital Signs BP (!) 115/58 (BP Location: Right Arm)   Pulse (!) 105   Temp 99 F (37.2 C) (Oral)   Resp 16   Ht 5\' 3"  (1.6 m)   Wt 68 kg   LMP 09/26/2017   SpO2 100%   BMI 26.57 kg/m   Physical Exam  Constitutional: She is oriented to person, place, and time. She appears well-developed and well-nourished. No distress.  HENT:  Head: Normocephalic and atraumatic.  Eyes: EOM are normal.  Neck: Neck supple.  Cardiovascular: Normal rate, regular rhythm, normal heart sounds and intact distal pulses.  Pulmonary/Chest: Effort normal and breath sounds normal.  Abdominal: Soft. She exhibits no distension. There is no guarding.  Patient denies significant tenderness but reports that lying on her back is making the right flank hurt much more.  Pressure in the  abdomen increased right flank pain.  No guarding.  Lower abdomen nontender.  Positive right flank pain to percussion.  No rash or other visible soft tissue abnormality.  Musculoskeletal: Normal range of motion. She exhibits no edema or tenderness.  Neurological: She is alert and oriented to person, place, and time. She exhibits normal muscle tone. Coordination normal.  Skin: Skin is warm and dry.  Psychiatric: She has a normal mood and affect.     ED Treatments / Results  Labs (all labs ordered are listed, but only abnormal results are displayed) Labs Reviewed  URINALYSIS, ROUTINE W REFLEX MICROSCOPIC - Abnormal; Notable for the following components:      Result Value   Color, Urine AMBER (*)    APPearance CLOUDY (*)    Specific Gravity, Urine 1.031 (*)    Protein, ur 30 (*)    Leukocytes, UA MODERATE (*)    WBC, UA >50 (*)    Bacteria, UA RARE (*)    All other components within normal limits  CBC - Abnormal; Notable for the following components:   RBC 3.49 (*)    Hemoglobin 9.6 (*)    HCT 32.1 (*)    MCHC 29.9 (*)    Platelets 529 (*)    All other components within normal limits  URINE CULTURE  LIPASE, BLOOD  COMPREHENSIVE METABOLIC PANEL  I-STAT BETA HCG BLOOD, ED ([facility], [facility], [facility] ONLY)    EKG None  Radiology Ct Renal Stone Study  Result Date: 10/08/2017 CLINICAL DATA:  Right flank pain starting 2 days ago. History of fibroids. EXAM: CT ABDOMEN AND PELVIS WITHOUT CONTRAST TECHNIQUE: Multidetector CT imaging of the abdomen and pelvis was performed following the standard protocol without IV contrast. COMPARISON:  Pelvic ultrasound 10/23/2016, 10/23/2016 CT FINDINGS: Lower chest: No acute abnormality. Hepatobiliary: Minimal biliary sludge noted within the gallbladder without stones. No secondary signs of acute cholecystitis. The unenhanced liver is unremarkable. Pancreas: Normal Spleen: Normal Adrenals/Urinary Tract: Subtle 3 mm hyperdensity in the interpolar right kidney  compatible with a nonobstructing stone. No hydroureteronephrosis of either kidney. The urinary bladder is decompressed in appearance and nonacute. Stomach/Bowel: Normal appendix. No bowel obstruction or inflammation. Stomach is unremarkable. Vascular/Lymphatic: Normal caliber aorta without adenopathy. Reproductive: A 2.6 x 3.6 x 2.4 cm (CC by transverse by AP) calcified intramural fibroid is seen on the right. No adnexal mass. Other: Small amount of free fluid in the pelvis likely physiologic. Musculoskeletal: Nonacute IMPRESSION: 1. Nonobstructing 3 mm  interpolar calculus of the right kidney. 2. Layering sludge within the gallbladder without secondary signs of acute cholecystitis. 3. Right-sided calcified uterine fibroid.  No adnexal mass. 4. Normal appendix.  No bowel obstruction or inflammation. Electronically Signed   By: Ashley Royalty M.D.   On: 10/08/2017 18:11    Procedures Procedures (including critical care time)  Medications Ordered in ED Medications  sodium chloride 0.9 % bolus 1,000 mL (0 mLs Intravenous Stopped 10/08/17 2007)    Followed by  0.9 %  sodium chloride infusion (has no administration in time range)  cefTRIAXone (ROCEPHIN) 1 g in sodium chloride 0.9 % 100 mL IVPB (0 g Intravenous Stopped 10/08/17 2007)  ketorolac (TORADOL) 30 MG/ML injection 30 mg (30 mg Intravenous Given 10/08/17 1809)  ondansetron (ZOFRAN) injection 4 mg (4 mg Intravenous Given 10/08/17 1809)     Initial Impression / Assessment and Plan / ED Course  I have reviewed the triage vital signs and the nursing notes.  Pertinent labs & imaging results that were available during my care of the patient were reviewed by me and considered in my medical decision making (see chart for details).    Recheck: Patient felt much improved after fluids and Toradol.  She reports now she is able to twist and lie back without severe pain.  Final Clinical Impressions(s) / ED Diagnoses   Final diagnoses:  Pyelonephritis    Gallbladder sludge  Patient has severe right flank pain.  CT shows a small stone in the renal pole but none in the ureter.  This would be unlikely to cause her pain.  Urine is grossly infected.  I suspect this is secondary to pyelo-nephritis.  CT does not show significant hydro or inflammation of the kidney.  Gallbladder sludge is identified.  I have lower suspicion for cholecystitis.  Patient does not have reproducible right upper quadrant tenderness and no inflammatory changes around the gallbladder.  She is advised of the finding.  Suggestion is to follow gallbladder diet at this time.  She will start treatment for pyelonephritis.  Return precautions are reviewed.  Patient does not have a PCP.  She is advised to get one ASAP this week.  If she has any worsening or changing of her condition she is to return to the emergency department.  ED Discharge Orders         Ordered    cephALEXin (KEFLEX) 500 MG capsule  2 times daily     10/08/17 2010    ibuprofen (ADVIL,MOTRIN) 800 MG tablet  3 times daily     10/08/17 2010    traMADol (ULTRAM) 50 MG tablet  Every 6 hours PRN     10/08/17 2010           Charlesetta Shanks, MD 10/08/17 2015

## 2017-10-08 NOTE — Discharge Instructions (Addendum)
1.  Return to the emergency department if you develop vomiting, worsening pain, fever or any worsening symptoms. 2.  Your pain and symptoms appear most likely to be due to kidney infection.  You however also have "sludge" in the gallbladder.  Follow the eating plan for gallbladder and schedule follow-up with a primary care doctor as soon as possible.  Use the referral number to discharge instructions to find a doctor on Monday.

## 2017-10-11 LAB — URINE CULTURE

## 2018-11-14 ENCOUNTER — Emergency Department (HOSPITAL_COMMUNITY)
Admission: EM | Admit: 2018-11-14 | Discharge: 2018-11-14 | Disposition: A | Discharge status: Home / Self Care | Payer: Medicaid Other | Attending: Emergency Medicine | Admitting: Emergency Medicine

## 2018-11-14 ENCOUNTER — Other Ambulatory Visit: Payer: Self-pay

## 2018-11-14 ENCOUNTER — Encounter (HOSPITAL_COMMUNITY): Payer: Self-pay | Admitting: Emergency Medicine

## 2018-11-14 DIAGNOSIS — I10 Essential (primary) hypertension: Secondary | ICD-10-CM | POA: Insufficient documentation

## 2018-11-14 DIAGNOSIS — K0889 Other specified disorders of teeth and supporting structures: Secondary | ICD-10-CM | POA: Insufficient documentation

## 2018-11-14 DIAGNOSIS — Z79899 Other long term (current) drug therapy: Secondary | ICD-10-CM | POA: Insufficient documentation

## 2018-11-14 MED ORDER — AMOXICILLIN-POT CLAVULANATE 875-125 MG PO TABS
1.0000 | ORAL_TABLET | Freq: Once | ORAL | Status: AC
  Administered 2018-11-14: 1 via ORAL
  Filled 2018-11-14: qty 1

## 2018-11-14 MED ORDER — IBUPROFEN 200 MG PO TABS
600.0000 mg | ORAL_TABLET | Freq: Once | ORAL | Status: AC
  Administered 2018-11-14: 600 mg via ORAL
  Filled 2018-11-14: qty 3

## 2018-11-14 MED ORDER — AMOXICILLIN 500 MG PO TABS: 500.0000 mg | ORAL_TABLET | Freq: Three times a day (TID) | ORAL | 0 refills | Status: AC

## 2018-11-14 NOTE — Discharge Instructions (Addendum)
I am sorry that you are in so much pain at this time.  We have arranged for you to go to Dr. Drinda Butts office to obtain emergency dental care.  We provided you with that number and ask you to make the appointment. In the emergency department, we gave you 1 dose of antibiotic called Augmentin.  We are sending you home with a an antibiotic called amoxicillin which I would like you to take 3 times a day for 7 days.  Please do not stop taking this medicine even if your tooth is feeling better.  It is important that you finish the entire course.  If you have any questions about this, please asked Dr. Haig Prophet.  You can start your first dose of amoxicillin this evening.  Please continue to take ibuprofen for pain.  You can take up to 800 mg (4 regular strength pills) up to 4 times a day.  Please try to limit the ibuprofen to 5 days of use as it can be harmful to your kidney and your stomach.  Please take ibuprofen with food.  If you experience any fevers, chills, severe malaise, please call 911 or seek care soon as possible.

## 2018-11-14 NOTE — ED Provider Notes (Signed)
[facility] DEPT Provider Note   CSN: [id number] Arrival date & time: 11/14/18  [id number]  History    Chief Complaint  Patient presents with  . Dental Pain  . Oral Swelling    HPI Sue Miller is a 23 y.o. female with no significant PMHx , who presents to the ED with tooth pain.  Patient reports that she is having right-sided mandible pain and cannot tell if the upper or lower teeth.  She reports that she recently had a dental procedure on the upper maxillary tooth in the back.  She does not follow with a dentist but was able to see the dentist 1 time for the pain she was experiencing.  She denies any fevers at home, chills.  She denies any difficulty with p.o. intake, breathing, any muffled voice.  Currently, she is just experiencing a lot of pain.  Patient denies any foul taste in her mouth or draining.  Allergies: Patient has no known allergies. Medications:  Current Outpatient Medications  Medication Instructions  . acetaminophen (TYLENOL) 650 mg, Oral, Every 6 hours PRN  . amLODipine (NORVASC) 10 mg, Oral, Daily  . amoxicillin (AMOXIL) 500 mg, Oral, 3 times daily, Start on evening of 11/14/18  . cephALEXin (KEFLEX) 1,000 mg, Oral, 2 times daily  . ibuprofen (ADVIL) 600 mg, Oral, Every 6 hours  . ibuprofen (ADVIL) 800 mg, Oral, 3 times daily  . metroNIDAZOLE (FLAGYL) 500 mg, Oral, 2 times daily  . ondansetron (ZOFRAN ODT) 4 mg, Oral, Every 8 hours PRN  . traMADol (ULTRAM) 100 mg, Oral, Every 6 hours PRN    Past Medical/Surgical History Past Medical History:  Diagnosis Date  . Uterine fibroid     Patient Active Problem List   Diagnosis Date Noted  . Essential hypertension 06/10/2016    Past Surgical History:  Procedure Laterality Date  . NO PAST SURGERIES      OB History  Gravida Para Term Preterm AB Living  1 1 1  0 0 1  SAB TAB Ectopic Multiple Live Births  0 0 0 0 1    Family History  Adopted: Yes  Problem Relation Age of Onset  .  Cancer Neg Hx   . Hypertension Neg Hx    Social History   Tobacco Use  . Smoking status: Never Smoker  . Smokeless tobacco: Never Used  Substance Use Topics  . Alcohol use: No  . Drug use: No   Review of Systems Review of Systems  Constitutional: Negative for appetite change, chills, diaphoresis and fever.  HENT: Positive for dental problem and ear pain. Negative for drooling, ear discharge, sinus pressure and sore throat.   Eyes: Negative for pain and visual disturbance.  Respiratory: Negative for cough and shortness of breath.   Cardiovascular: Negative for chest pain and palpitations.  Gastrointestinal: Negative for abdominal pain and vomiting.  Musculoskeletal: Negative for arthralgias and back pain.  Skin: Negative for color change and rash.  Neurological: Positive for headaches. Negative for dizziness, seizures and syncope.  All other systems reviewed and are negative.   Physical Exam Updated Vital Signs BP (!) 133/91   Pulse 70   Temp 99.7 F (37.6 C) (Oral)   Resp 16   Wt 82.1 kg   LMP 11/11/2018   SpO2 100%   BMI 32.06 kg/m   Physical Exam Vitals signs and nursing note reviewed.  Constitutional:      General: She is not in acute distress.    Appearance: She is well-developed.  HENT:     Head: Normocephalic and atraumatic.     Right Ear: Tympanic membrane and ear canal normal.     Left Ear: Tympanic membrane and ear canal normal.     Mouth/Throat:     Mouth: Mucous membranes are moist.     Tongue: No lesions. Tongue does not deviate from midline.     Palate: No mass and lesions.     Pharynx: Oropharynx is clear. Uvula midline.     Tonsils: No tonsillar exudate or tonsillar abscesses.      Comments: Erythema and swelling to palpation  Eyes:     Conjunctiva/sclera: Conjunctivae normal.  Neck:     Musculoskeletal: Neck supple.  Cardiovascular:     Rate and Rhythm: Normal rate and regular rhythm.     Heart sounds: No murmur.  Pulmonary:     Effort:  Pulmonary effort is normal. No respiratory distress.     Breath sounds: Normal breath sounds.  Abdominal:     Palpations: Abdomen is soft.     Tenderness: There is no abdominal tenderness.  Skin:    General: Skin is warm and dry.  Neurological:     Mental Status: She is alert.    ED Treatments / Results  Labs (all labs ordered are listed, but only abnormal results are displayed) Labs Reviewed - No data to display  EKG None  Radiology No results found.  Procedures Procedures (including critical care time)  Medications Ordered in ED Medications  amoxicillin-clavulanate (AUGMENTIN) 875-125 MG per tablet 1 tablet (1 tablet Oral Given 11/14/18 0952)  ibuprofen (ADVIL) tablet 600 mg (600 mg Oral Given 11/14/18 [id number])    Initial Impression / Assessment and Plan / ED Course  I have reviewed the triage vital signs and the nursing notes. Pertinent labs & imaging results that were available during my care of the patient were reviewed by me and considered in my medical decision making (see chart for details). Patient with severe dental infection limiting ability to open mouth, but not limiting oral intake, breathing or causing muffled voice at this time.  Patient also reports that she is having pain in her right ear as well as a headache in her right parietal area.  She denies any fever at home.  She has been trying to take Tylenol for pain.  Her temperature today is 99.7 on arrival to the ED.  Patient is tearful and has a lot of pain with physical exam.  The pain is on the right posterior mandible.  Likely on tooth 1 or 2.  Patient denies any draining or foul tasting fluid in her mouth.  Call dentist on call and was able to arrange for patient to go to dentist with [facility] [facility] as she does not have Medicaid or insurance.  We will refer her to the doctor on call and have her bring her discharge papers so that she can receive dental care soon as possible.  For pain, gave 600 mg of  ibuprofen.  Also provided 875-160 Augmentin x1 in the ED.  Sent patient home with amoxicillin prescription for 500 mg 3 times daily x7 days.  Patient to start the amoxicillin this evening.    Final Clinical Impressions(s) / ED Diagnoses   Final diagnoses:  Pain, dental   ED Discharge Orders         Ordered    amoxicillin (AMOXIL) 500 MG tablet  3 times daily     11/14/18 1013  Disposition: home w/ f/u with dentist   Wilber Oliphant, M.D. FM PGY-2      Wilber Oliphant, MD 11/14/18 1013    Blanchie Dessert, MD 11/14/18 2147

## 2018-11-14 NOTE — ED Triage Notes (Signed)
Pt c/o right dental pain with swelling for couple days. Hurts up to right  Ear.

## 2018-11-14 NOTE — ED Notes (Signed)
Pt has appointment at 1:30 p today to f/u with Dr Haig Prophet.

## 2019-05-19 ENCOUNTER — Ambulatory Visit: Payer: Medicaid Other | Attending: Internal Medicine

## 2019-05-19 DIAGNOSIS — Z23 Encounter for immunization: Secondary | ICD-10-CM

## 2019-05-19 NOTE — Progress Notes (Signed)
   Covid-19 Vaccination Clinic  Name:  Sue Miller    MRN: [id number] DOB: 30-Dec-1995  05/19/2019  Sue Miller was observed post Covid-19 immunization for 15 minutes without incident. She was provided with Vaccine Information Sheet and instruction to access the V-Safe system.   Sue Miller was instructed to call 911 with any severe reactions post vaccine: Marland Kitchen Difficulty breathing  . Swelling of face and throat  . A fast heartbeat  . A bad rash all over body  . Dizziness and weakness   Immunizations Administered    Name Date Dose VIS Date Route   Pfizer COVID-19 Vaccine 05/19/2019  8:23 AM 0.3 mL 03/14/2018 Intramuscular   Manufacturer: [place]   Lot: [id number]   [place]: [id number]

## 2019-06-11 ENCOUNTER — Ambulatory Visit: Payer: Medicaid Other

## 2019-06-13 ENCOUNTER — Ambulatory Visit: Payer: Medicaid Other | Attending: Internal Medicine

## 2019-06-13 DIAGNOSIS — Z23 Encounter for immunization: Secondary | ICD-10-CM

## 2019-06-13 NOTE — Progress Notes (Signed)
   Covid-19 Vaccination Clinic  Name:  Sue Miller    MRN: [id number] DOB: 04/07/1995  06/13/2019  [name] was observed post Covid-19 immunization for 15 minutes without incident. She was provided with Vaccine Information Sheet and instruction to access the V-Safe system.   [name] was instructed to call 911 with any severe reactions post vaccine: Marland Kitchen Difficulty breathing  . Swelling of face and throat  . A fast heartbeat  . A bad rash all over body  . Dizziness and weakness   Immunizations Administered    Name Date Dose VIS Date Route   Pfizer COVID-19 Vaccine 06/13/2019  3:44 PM 0.3 mL 03/14/2018 Intramuscular   Manufacturer: [place]   Lot: [id number]   [place]: [id number]

## 2019-07-02 IMAGING — CT CT RENAL STONE PROTOCOL
2 of 4 series · 16 of 46 positions shown, 18 images · non-contrast
Comparison: Pelvic ultrasound 10/23/2016, 10/23/2016 CT

CLINICAL DATA: Right flank pain starting 2 days ago. History of
fibroids.

EXAM:
CT ABDOMEN AND PELVIS WITHOUT CONTRAST
TECHNIQUE: Multidetector CT imaging of the abdomen and pelvis was performed
following the standard protocol without IV contrast.

[Series 3: renal stone 5.0 · axial · 0.62mm/px · z∈[+766,+1170]mm · 13 of 92 slices shown, 15 images]
[im 7/92  soft-tissue]
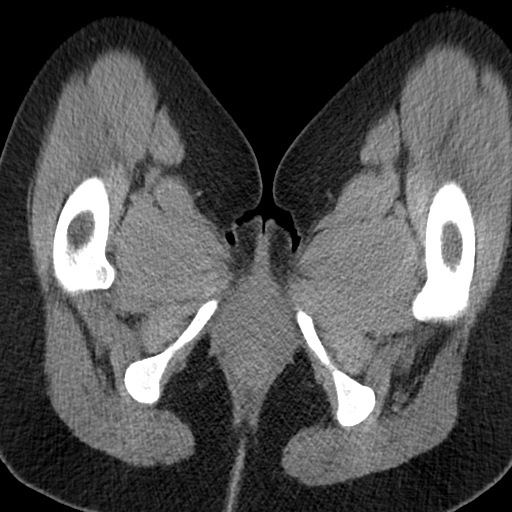
[im 7/92  bone]
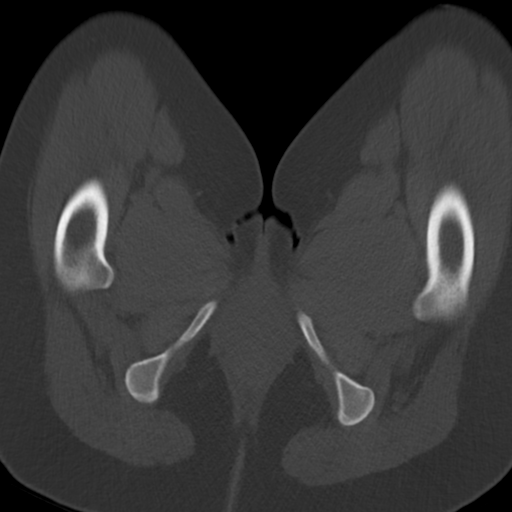
[im 14/92  soft-tissue]
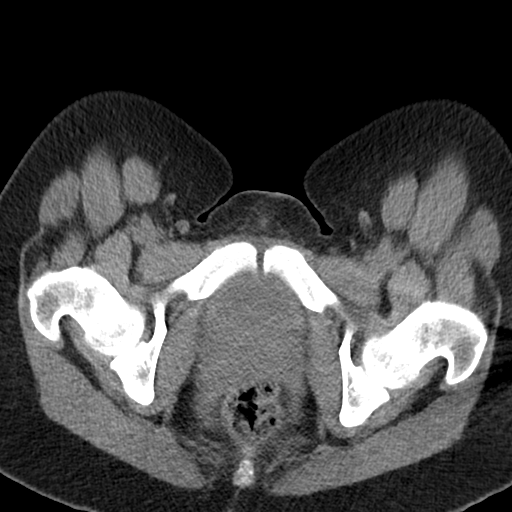
[im 21/92  soft-tissue]
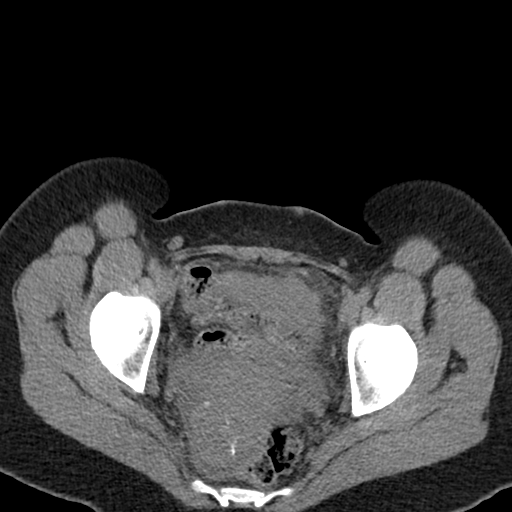
[im 27/92  soft-tissue]
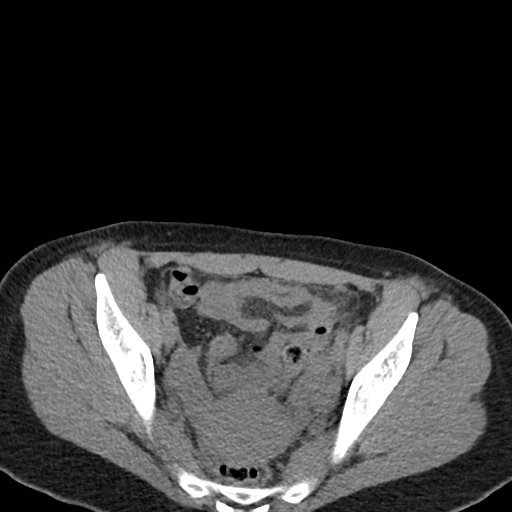
[im 34/92  soft-tissue]
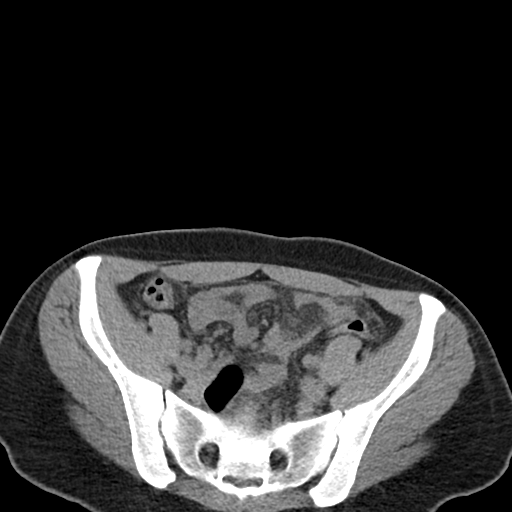
[im 41/92  soft-tissue]
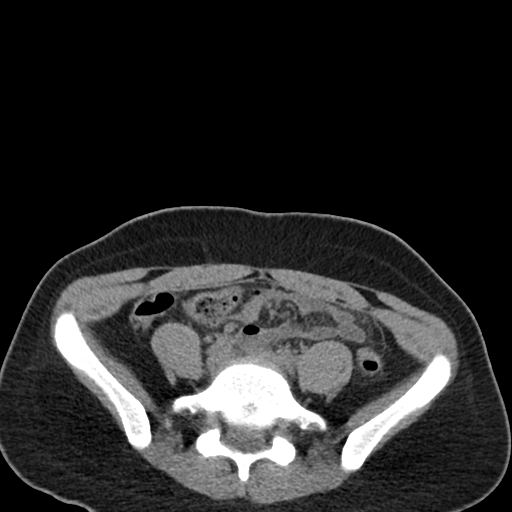
[im 48/92  soft-tissue]
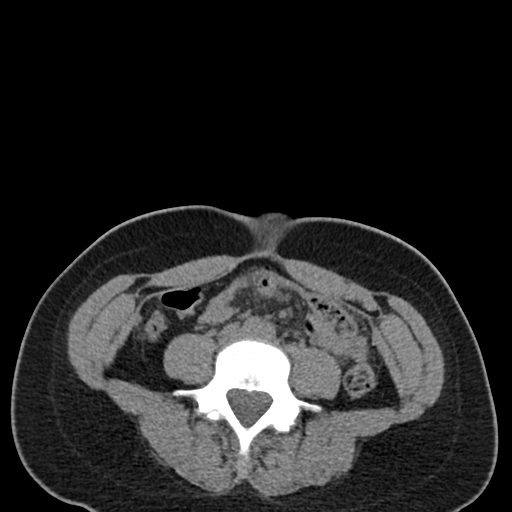
[im 54/92  soft-tissue]
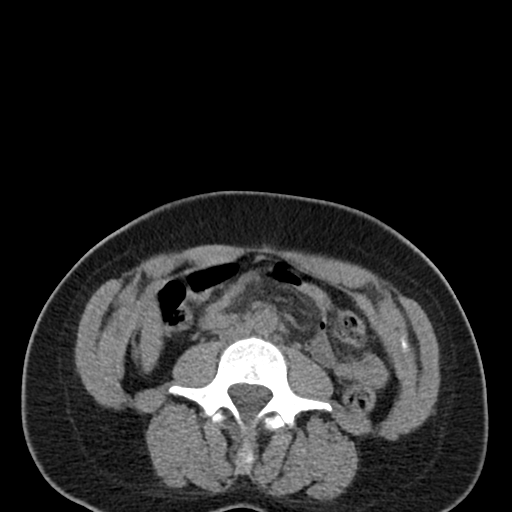
[im 61/92  soft-tissue]
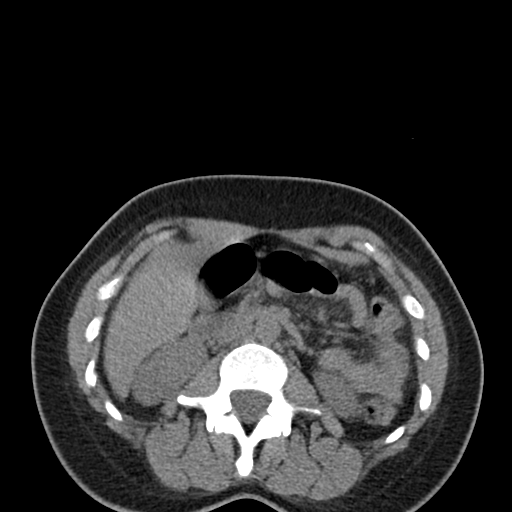
[im 61/92  bone]
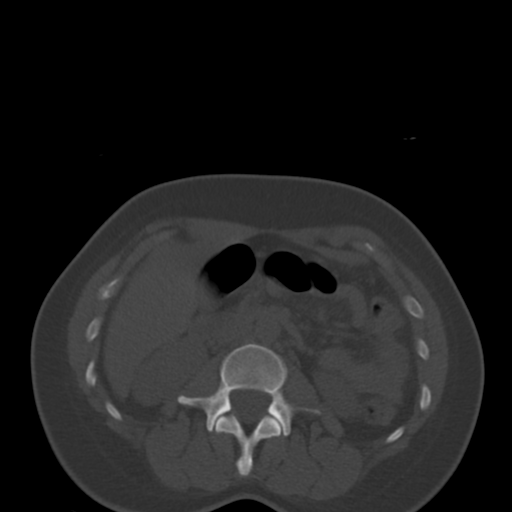
[im 68/92  soft-tissue]
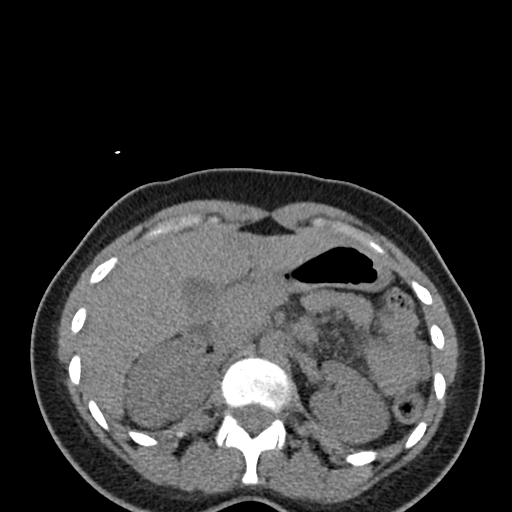
[im 75/92  soft-tissue]
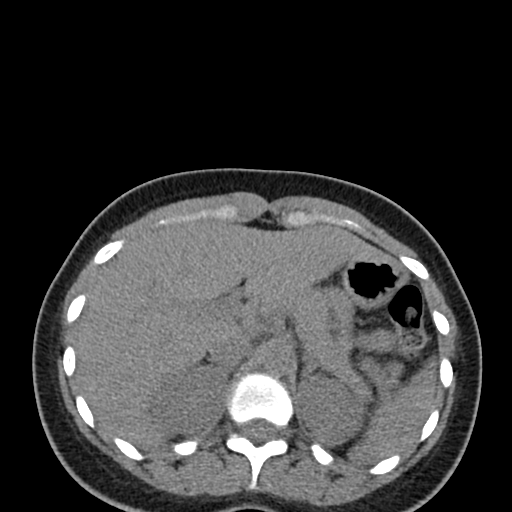
[im 81/92  soft-tissue]
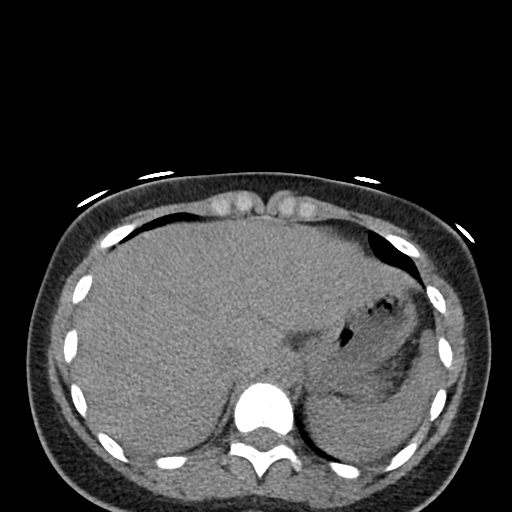
[im 88/92  soft-tissue]
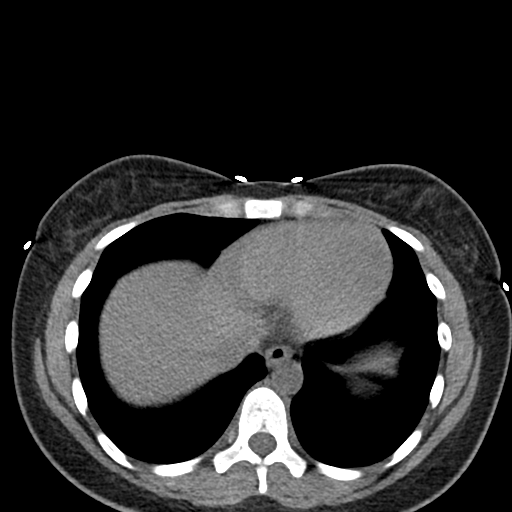

[Series 5: renal stone 3.0 cor · coronal · 0.66mm/px · 3 of 101 slices shown]
[im 34/101  soft-tissue]
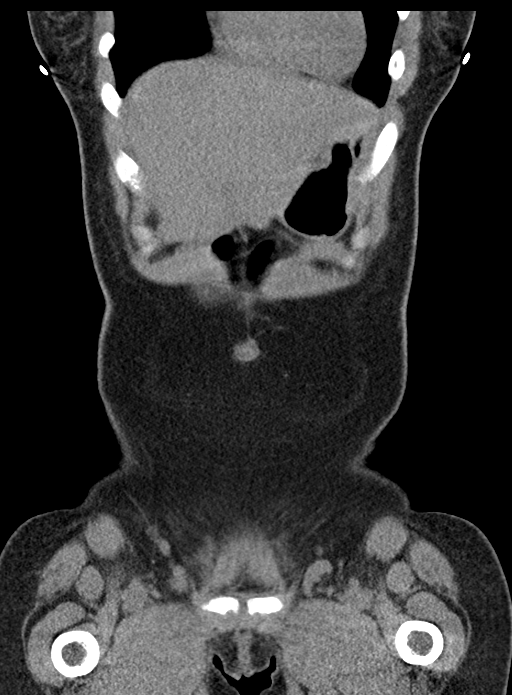
[im 45/101  soft-tissue]
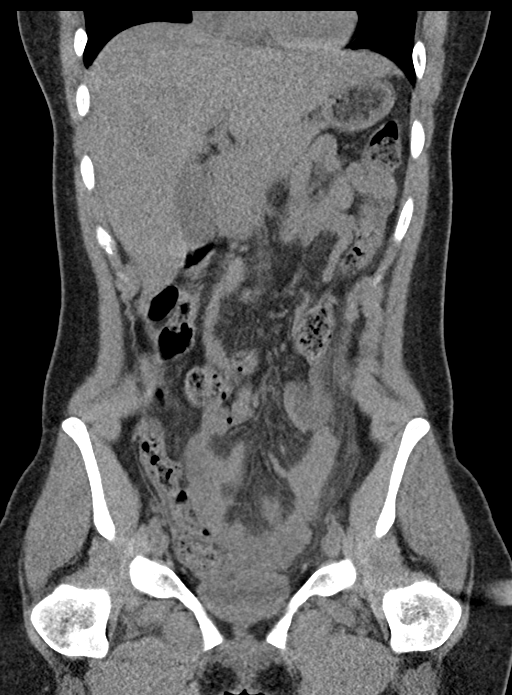
[im 56/101  soft-tissue]
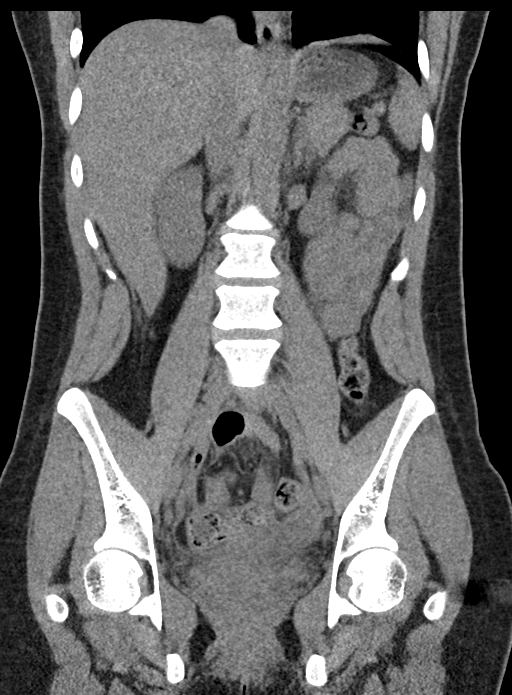

[16 of 46 positions shown; findings below may reference images not displayed]

FINDINGS: Lower chest: No acute abnormality.

Hepatobiliary: Minimal biliary sludge noted within the gallbladder
without stones. No secondary signs of acute cholecystitis. The
unenhanced liver is unremarkable.

Pancreas: Normal

Spleen: Normal

Adrenals/Urinary Tract: Subtle 3 mm hyperdensity in the interpolar
right kidney compatible with a nonobstructing stone. No
hydroureteronephrosis of either kidney. The urinary bladder is
decompressed in appearance and nonacute.

Stomach/Bowel: Normal appendix. No bowel obstruction or
inflammation. Stomach is unremarkable.

Vascular/Lymphatic: Normal caliber aorta without adenopathy.

Reproductive: A 2.6 x 3.6 x 2.4 cm (CC by transverse by AP)
calcified intramural fibroid is seen on the right. No adnexal mass.

Other: Small amount of free fluid in the pelvis likely physiologic.

Musculoskeletal: Nonacute
IMPRESSION: 1. Nonobstructing 3 mm interpolar calculus of the right kidney.
2. Layering sludge within the gallbladder without secondary signs of
acute cholecystitis.
3. Right-sided calcified uterine fibroid.  No adnexal mass.
4. Normal appendix.  No bowel obstruction or inflammation.

## 2020-07-17 ENCOUNTER — Encounter: Payer: Self-pay | Admitting: *Deleted

## 2020-07-22 ENCOUNTER — Telehealth: Payer: Self-pay

## 2020-07-22 NOTE — Telephone Encounter (Signed)
1:52P- 2nd attempt for New OB Intake, still no answer.Pts New OB visit is scheduled for 07/30/20@ 3:15p.

## 2020-07-22 NOTE — Telephone Encounter (Signed)
1:33p-Called Pt to start New OB Intake,no answer, rcvd message stating call cannot be completed @this  time, please hang up & call again later.

## 2020-07-29 NOTE — Progress Notes (Signed)
This encounter was created in error - please disregard.

## 2020-07-30 ENCOUNTER — Other Ambulatory Visit (HOSPITAL_COMMUNITY)
Admission: RE | Admit: 2020-07-30 | Discharge: 2020-07-30 | Disposition: A | Discharge status: Home / Self Care | Payer: Medicaid Other | Source: Ambulatory Visit | Attending: Family Medicine | Admitting: Family Medicine

## 2020-07-30 ENCOUNTER — Other Ambulatory Visit: Payer: Self-pay

## 2020-07-30 ENCOUNTER — Ambulatory Visit (INDEPENDENT_AMBULATORY_CARE_PROVIDER_SITE_OTHER): Payer: Medicaid Other | Admitting: Family Medicine

## 2020-07-30 ENCOUNTER — Encounter: Payer: Self-pay | Admitting: Family Medicine

## 2020-07-30 VITALS — BP 126/79 | HR 126 | Wt 239.0 lb

## 2020-07-30 DIAGNOSIS — O10919 Unspecified pre-existing hypertension complicating pregnancy, unspecified trimester: Secondary | ICD-10-CM

## 2020-07-30 DIAGNOSIS — D509 Iron deficiency anemia, unspecified: Secondary | ICD-10-CM

## 2020-07-30 DIAGNOSIS — Z3401 Encounter for supervision of normal first pregnancy, first trimester: Secondary | ICD-10-CM

## 2020-07-30 DIAGNOSIS — O09299 Supervision of pregnancy with other poor reproductive or obstetric history, unspecified trimester: Secondary | ICD-10-CM

## 2020-07-30 DIAGNOSIS — O0990 Supervision of high risk pregnancy, unspecified, unspecified trimester: Secondary | ICD-10-CM

## 2020-07-30 DIAGNOSIS — O99019 Anemia complicating pregnancy, unspecified trimester: Secondary | ICD-10-CM

## 2020-07-30 MED ORDER — BLOOD PRESSURE KIT DEVI: 1.0000 | Freq: Once | 0 refills | Status: AC

## 2020-07-30 MED ORDER — GOJJI WEIGHT SCALE MISC: 1.0000 | Freq: Once | 0 refills | Status: AC

## 2020-07-30 NOTE — Progress Notes (Signed)
Subjective:  Sue Miller is a G2P1001 [redacted]w[redacted]d being seen today for her first obstetrical visit.  Her obstetrical history is significant for pre-eclampsia - was induced for preeclampsia with result of vaginal delivery. Desired pregnancy. Patient does intend to breast feed. Pregnancy history fully reviewed.  Patient reports no complaints.  BP 126/79   Pulse (!) 126   Wt 239 lb (108.4 kg)   LMP 03/18/2020   BMI 42.34 kg/m   HISTORY: OB History  Gravida Para Term Preterm AB Living  2 1 1     1   SAB IAB Ectopic Multiple Live Births        0 1    # Outcome Date GA Lbr Len/2nd Weight Sex Delivery Anes PTL Lv  2 Current           1 Term 03/28/16 [redacted]w[redacted]d 07:23 / 00:59 6 lb 13 oz (3.09 kg) M Vag-Spont EPI, Local  LIV    Past Medical History:  Diagnosis Date   Uterine fibroid     Past Surgical History:  Procedure Laterality Date   NO PAST SURGERIES      Family History  Adopted: Yes  Problem Relation Age of Onset   Cancer Neg Hx    Hypertension Neg Hx      Exam  BP 126/79   Pulse (!) 126   Wt 239 lb (108.4 kg)   LMP 03/18/2020   BMI 42.34 kg/m   Chaperone present during exam  CONSTITUTIONAL: Well-developed, well-nourished female in no acute distress.  HENT:  Normocephalic, atraumatic, External right and left ear normal. Oropharynx is clear and moist EYES: Conjunctivae and EOM are normal. Pupils are equal, round, and reactive to light. No scleral icterus.  NECK: Normal range of motion, supple, no masses.  Normal thyroid.  CARDIOVASCULAR: Normal heart rate noted, regular rhythm RESPIRATORY: Clear to auscultation bilaterally. Effort and breath sounds normal, no problems with respiration noted. BREASTS: not done ABDOMEN: Soft, normal bowel sounds, no distention noted.  No tenderness, rebound or guarding.  PELVIC: Normal appearing external genitalia; normal appearing vaginal mucosa and cervix. No abnormal discharge noted. Normal uterine size, no other palpable masses, no  uterine or adnexal tenderness. MUSCULOSKELETAL: Normal range of motion. No tenderness.  No cyanosis, clubbing, or edema.  2+ distal pulses. SKIN: Skin is warm and dry. No rash noted. Not diaphoretic. No erythema. No pallor. NEUROLOGIC: Alert and oriented to person, place, and time. Normal reflexes, muscle tone coordination. No cranial nerve deficit noted. PSYCHIATRIC: Normal mood and affect. Normal behavior. Normal judgment and thought content.    Assessment:    Pregnancy: G2P1001 Patient Active Problem List   Diagnosis Date Noted   Supervision of high risk pregnancy, antepartum 07/30/2020   Essential hypertension 06/10/2016      Plan:   1. Encounter for supervision of normal first pregnancy in first trimester FHT and FH normal - [facility] AMB BABYSCRIPTS SCHEDULE OPTIMIZATION - [place] MFM OB DETAIL +14 WK; Future - Culture, OB Urine - Genetic Screening - CBC/D/Plt+RPR+Rh+ABO+RubIgG... - Cytology - PAP( [place]) - Hemoglobin A1c - Comprehensive metabolic panel - Protein / creatinine ratio, urine  3. Chronic hypertension affecting pregnancy Did have elevated BP in between pregnancy. Will treat as chronic HTN ASA 81mg  daily.  4. History of pre-eclampsia in prior pregnancy, currently pregnant ASA Baseline lab   Initial labs obtained Continue prenatal vitamins Reviewed n/v relief measures and warning s/s to report Reviewed recommended weight gain based on pre-gravid BMI Encouraged well-balanced diet Genetic & carrier screening  discussed: requests Panorama,  Ultrasound discussed; fetal survey: requested [place] completed> form faxed if has or is planning to apply for medicaid The nature of [place] for [employer] with multiple MDs and other [place] Providers was explained to patient; also emphasized that fellows, residents, and students are part of our team.   Problem list reviewed and updated. 75% of 30 min visit spent on counseling and coordination  of care.     [name] 07/30/2020

## 2020-07-31 LAB — CBC/D/PLT+RPR+RH+ABO+RUBIGG...
Antibody Screen: NEGATIVE
Basophils Absolute: 0 10*3/uL (ref 0.0–0.2)
Basos: 0 %
EOS (ABSOLUTE): 0.2 10*3/uL (ref 0.0–0.4)
Eos: 2 %
HCV Ab: <0.1 s/co ratio (ref 0.0–0.9)
HIV Screen 4th Generation wRfx: NONREACTIVE
Hematocrit: 27 % — ABNORMAL LOW (ref 34.0–46.6)
Hemoglobin: 8.8 g/dL — ABNORMAL LOW (ref 11.1–15.9)
Hepatitis B Surface Ag: NEGATIVE
Immature Grans (Abs): 0.1 10*3/uL (ref 0.0–0.1)
Immature Granulocytes: 1 %
Lymphocytes Absolute: 1.3 10*3/uL (ref 0.7–3.1)
Lymphs: 14 %
MCH: 26.1 pg — ABNORMAL LOW (ref 26.6–33.0)
MCHC: 32.6 g/dL (ref 31.5–35.7)
MCV: 80 fL (ref 79–97)
Monocytes Absolute: 0.9 10*3/uL (ref 0.1–0.9)
Monocytes: 9 %
Neutrophils Absolute: 6.9 10*3/uL (ref 1.4–7.0)
Neutrophils: 74 %
Platelets: 394 10*3/uL (ref 150–450)
RBC: 3.37 x10E6/uL — ABNORMAL LOW (ref 3.77–5.28)
RDW: 15.3 % (ref 11.7–15.4)
RPR Ser Ql: NONREACTIVE
Rh Factor: POSITIVE
Rubella Antibodies, IGG: 1.29 index (ref >0.99)
WBC: 9.4 10*3/uL (ref 3.4–10.8)

## 2020-07-31 LAB — HEMOGLOBIN A1C
Est. average glucose Bld gHb Est-mCnc: 100 mg/dL
Hgb A1c MFr Bld: 5.1 % (ref 4.8–5.6)

## 2020-07-31 LAB — COMPREHENSIVE METABOLIC PANEL
ALT: 18 IU/L (ref 0–32)
AST: 20 IU/L (ref 0–40)
Albumin/Globulin Ratio: 1.3 (ref 1.2–2.2)
Albumin: 4 g/dL (ref 3.9–5.0)
Alkaline Phosphatase: 114 IU/L (ref 44–121)
BUN/Creatinine Ratio: 11 (ref 9–23)
BUN: 6 mg/dL (ref 6–20)
Bilirubin Total: <0.2 mg/dL (ref 0.0–1.2)
CO2: 17 mmol/L — ABNORMAL LOW (ref 20–29)
Calcium: 9.8 mg/dL (ref 8.7–10.2)
Chloride: 104 mmol/L (ref 96–106)
Creatinine, Ser: 0.54 mg/dL — ABNORMAL LOW (ref 0.57–1.00)
Globulin, Total: 3 g/dL (ref 1.5–4.5)
Glucose: 89 mg/dL (ref 65–99)
Potassium: 4.3 mmol/L (ref 3.5–5.2)
Sodium: 140 mmol/L (ref 134–144)
Total Protein: 7 g/dL (ref 6.0–8.5)
eGFR: 130 mL/min/{1.73_m2} (ref >59)

## 2020-07-31 LAB — PROTEIN / CREATININE RATIO, URINE
Creatinine, Urine: 213.1 mg/dL
Protein, Ur: 47.9 mg/dL
Protein/Creat Ratio: 225 mg/g creat — ABNORMAL HIGH (ref 0–200)

## 2020-07-31 LAB — HCV INTERPRETATION

## 2020-08-01 ENCOUNTER — Encounter: Payer: Self-pay | Admitting: *Deleted

## 2020-08-01 LAB — URINE CULTURE, OB REFLEX

## 2020-08-01 LAB — CULTURE, OB URINE

## 2020-08-05 DIAGNOSIS — O09299 Supervision of pregnancy with other poor reproductive or obstetric history, unspecified trimester: Secondary | ICD-10-CM | POA: Insufficient documentation

## 2020-08-05 DIAGNOSIS — D509 Iron deficiency anemia, unspecified: Secondary | ICD-10-CM | POA: Insufficient documentation

## 2020-08-05 LAB — CYTOLOGY - PAP
Chlamydia: POSITIVE — AB
Comment: NEGATIVE
Comment: NEGATIVE
Comment: NORMAL
Diagnosis: NEGATIVE
Diagnosis: REACTIVE
Neisseria Gonorrhea: NEGATIVE
Trichomonas: NEGATIVE

## 2020-08-05 MED ORDER — AZITHROMYCIN 500 MG PO TABS: 1000.0000 mg | ORAL_TABLET | Freq: Once | ORAL | 1 refills | Status: AC

## 2020-08-05 NOTE — Addendum Note (Signed)
Addended by: [name] on: 08/05/2020 02:24 PM   Modules accepted: Orders, SmartSet

## 2020-08-21 ENCOUNTER — Encounter: Payer: Self-pay | Admitting: *Deleted

## 2020-08-26 ENCOUNTER — Encounter: Payer: Self-pay | Admitting: Nurse Practitioner

## 2020-08-26 ENCOUNTER — Telehealth: Payer: Self-pay | Admitting: Lactation Services

## 2020-08-26 DIAGNOSIS — O10919 Unspecified pre-existing hypertension complicating pregnancy, unspecified trimester: Secondary | ICD-10-CM | POA: Insufficient documentation

## 2020-08-26 NOTE — Telephone Encounter (Signed)
Per chart review, patient has not read message from MD about being + for Chlamydia and need for treatment.   Called patient x 3 and received a message that call could not be completed at this time. Called Step father and he reports he has no way to contact her and she has not lived with him in over a year.   Prescription was previously sent to the Pharmacy. Called Pharmacy and was informed patient has not picked up prescription.   STD card completed and faxed to [facility]  Also noted patient needs to be scheduled for Venofer 300 mg infusions weekly x 3.   Called Short Stay and LM for them to call to get patient scheduled for Venofer infusion.   Will need to attempt to call patient again in regards to Venofer infusions and + Chlamydia and then send letter.

## 2020-08-27 ENCOUNTER — Encounter: Payer: Medicaid Other | Admitting: Nurse Practitioner

## 2020-08-27 NOTE — Telephone Encounter (Addendum)
I called [facility] again and heard message " We're sorry , your call cannot be completed at this time" , not able to leave message. I called her contact ( Father ) and he confirmed he has not  had contact with her in over a year and does not have contact information for her. I will send a MyChart messatge. I will send a letter to her address on file. Fernado Brigante,RN

## 2020-08-29 ENCOUNTER — Ambulatory Visit: Payer: Medicaid Other | Attending: Family Medicine

## 2020-08-29 ENCOUNTER — Ambulatory Visit: Payer: Medicaid Other
